# Patient Record
Sex: Female | Born: 1935 | Race: White | Hispanic: No | Smoking: Former smoker
Health system: Southern US, Community
[De-identification: ages and names within clinical notes are randomized; demographics above are authoritative.]

## PROBLEM LIST (undated history)

## (undated) DIAGNOSIS — M1990 Unspecified osteoarthritis, unspecified site: Secondary | ICD-10-CM

## (undated) DIAGNOSIS — K219 Gastro-esophageal reflux disease without esophagitis: Secondary | ICD-10-CM

## (undated) DIAGNOSIS — I10 Essential (primary) hypertension: Secondary | ICD-10-CM

## (undated) DIAGNOSIS — J189 Pneumonia, unspecified organism: Secondary | ICD-10-CM

## (undated) HISTORY — PX: ABDOMINAL HYSTERECTOMY: SHX81

## (undated) HISTORY — PX: FOOT SURGERY: SHX648

## (undated) HISTORY — PX: BACK SURGERY: SHX140

## (undated) HISTORY — PX: ANKLE FRACTURE SURGERY: SHX122

---

## 2003-07-27 ENCOUNTER — Inpatient Hospital Stay (HOSPITAL_COMMUNITY): Admission: EM | Admit: 2003-07-27 | Discharge: 2003-07-30 | Payer: Self-pay | Admitting: Emergency Medicine

## 2003-07-30 ENCOUNTER — Inpatient Hospital Stay
Admission: RE | Admit: 2003-07-30 | Discharge: 2003-08-07 | Payer: Self-pay | Admitting: Physical Medicine & Rehabilitation

## 2005-05-09 ENCOUNTER — Ambulatory Visit: Payer: Self-pay | Admitting: Internal Medicine

## 2006-05-10 ENCOUNTER — Ambulatory Visit: Payer: Self-pay | Admitting: Internal Medicine

## 2007-05-13 ENCOUNTER — Ambulatory Visit: Payer: Self-pay | Admitting: Internal Medicine

## 2008-05-14 ENCOUNTER — Ambulatory Visit: Payer: Self-pay | Admitting: Rheumatology

## 2008-05-14 ENCOUNTER — Ambulatory Visit: Payer: Self-pay | Admitting: Internal Medicine

## 2009-06-25 ENCOUNTER — Ambulatory Visit: Payer: Self-pay | Admitting: Internal Medicine

## 2010-03-31 ENCOUNTER — Ambulatory Visit: Payer: Self-pay | Admitting: Gastroenterology

## 2010-06-06 ENCOUNTER — Ambulatory Visit: Payer: Self-pay | Admitting: Internal Medicine

## 2010-06-15 ENCOUNTER — Ambulatory Visit: Payer: Self-pay | Admitting: Gastroenterology

## 2010-06-20 ENCOUNTER — Ambulatory Visit: Payer: Self-pay | Admitting: Specialist

## 2010-06-27 ENCOUNTER — Ambulatory Visit: Payer: Self-pay | Admitting: Internal Medicine

## 2010-09-20 ENCOUNTER — Ambulatory Visit: Payer: Self-pay | Admitting: Specialist

## 2010-12-05 ENCOUNTER — Ambulatory Visit: Payer: Self-pay | Admitting: Internal Medicine

## 2011-01-09 ENCOUNTER — Ambulatory Visit: Payer: Self-pay | Admitting: Gastroenterology

## 2011-01-27 ENCOUNTER — Ambulatory Visit: Payer: Self-pay | Admitting: Specialist

## 2011-05-19 ENCOUNTER — Ambulatory Visit: Payer: Self-pay | Admitting: Internal Medicine

## 2011-08-03 ENCOUNTER — Ambulatory Visit: Payer: Self-pay | Admitting: Internal Medicine

## 2011-09-08 ENCOUNTER — Ambulatory Visit: Payer: Self-pay | Admitting: Specialist

## 2012-01-09 ENCOUNTER — Other Ambulatory Visit: Payer: Self-pay | Admitting: Gastroenterology

## 2012-01-09 DIAGNOSIS — K5732 Diverticulitis of large intestine without perforation or abscess without bleeding: Secondary | ICD-10-CM

## 2012-01-18 ENCOUNTER — Ambulatory Visit
Admission: RE | Admit: 2012-01-18 | Discharge: 2012-01-18 | Disposition: A | Discharge status: Home / Self Care | Payer: Medicare Other | Source: Ambulatory Visit | Attending: Gastroenterology | Admitting: Gastroenterology

## 2012-01-18 DIAGNOSIS — K5732 Diverticulitis of large intestine without perforation or abscess without bleeding: Secondary | ICD-10-CM

## 2012-02-26 ENCOUNTER — Ambulatory Visit: Payer: Self-pay | Admitting: Specialist

## 2012-08-05 ENCOUNTER — Ambulatory Visit: Payer: Self-pay | Admitting: Internal Medicine

## 2012-12-19 ENCOUNTER — Ambulatory Visit: Payer: Self-pay | Admitting: Internal Medicine

## 2013-03-07 ENCOUNTER — Ambulatory Visit: Payer: Self-pay | Admitting: Internal Medicine

## 2013-03-10 ENCOUNTER — Ambulatory Visit: Payer: Self-pay | Admitting: Internal Medicine

## 2013-04-24 ENCOUNTER — Ambulatory Visit: Payer: Self-pay | Admitting: Specialist

## 2013-08-06 ENCOUNTER — Ambulatory Visit: Payer: Self-pay | Admitting: Internal Medicine

## 2013-10-16 ENCOUNTER — Ambulatory Visit: Payer: Self-pay | Admitting: Specialist

## 2013-12-05 ENCOUNTER — Ambulatory Visit: Payer: Self-pay | Admitting: Internal Medicine

## 2013-12-11 ENCOUNTER — Other Ambulatory Visit: Payer: Self-pay | Admitting: Gastroenterology

## 2013-12-11 ENCOUNTER — Ambulatory Visit: Payer: Self-pay | Admitting: Internal Medicine

## 2013-12-11 LAB — CLOSTRIDIUM DIFFICILE(ARMC)

## 2013-12-13 LAB — STOOL CULTURE

## 2014-01-23 ENCOUNTER — Other Ambulatory Visit: Payer: Self-pay | Admitting: Gastroenterology

## 2014-01-23 DIAGNOSIS — K5792 Diverticulitis of intestine, part unspecified, without perforation or abscess without bleeding: Secondary | ICD-10-CM

## 2014-02-03 ENCOUNTER — Inpatient Hospital Stay: Admission: RE | Admit: 2014-02-03 | Payer: Medicare Other | Source: Ambulatory Visit

## 2014-02-17 ENCOUNTER — Ambulatory Visit
Admission: RE | Admit: 2014-02-17 | Discharge: 2014-02-17 | Disposition: A | Discharge status: Home / Self Care | Payer: Medicare Other | Source: Ambulatory Visit | Attending: Gastroenterology | Admitting: Gastroenterology

## 2014-02-17 DIAGNOSIS — K5792 Diverticulitis of intestine, part unspecified, without perforation or abscess without bleeding: Secondary | ICD-10-CM

## 2014-05-14 ENCOUNTER — Emergency Department: Payer: Self-pay | Admitting: Emergency Medicine

## 2014-05-14 LAB — COMPREHENSIVE METABOLIC PANEL
ALBUMIN: 3.5 g/dL (ref 3.4–5.0)
ALK PHOS: 55 U/L
Anion Gap: 11 (ref 7–16)
BUN: 9 mg/dL (ref 7–18)
Bilirubin,Total: 0.7 mg/dL (ref 0.2–1.0)
Calcium, Total: 8.6 mg/dL (ref 8.5–10.1)
Chloride: 95 mmol/L — ABNORMAL LOW (ref 98–107)
Co2: 27 mmol/L (ref 21–32)
Creatinine: 0.69 mg/dL (ref 0.60–1.30)
EGFR (African American): >60
GLUCOSE: 109 mg/dL — AB (ref 65–99)
Osmolality: 266 (ref 275–301)
POTASSIUM: 3.2 mmol/L — AB (ref 3.5–5.1)
SGOT(AST): 25 U/L (ref 15–37)
SGPT (ALT): 19 U/L
SODIUM: 133 mmol/L — AB (ref 136–145)
TOTAL PROTEIN: 6.2 g/dL — AB (ref 6.4–8.2)

## 2014-05-14 LAB — CBC WITH DIFFERENTIAL/PLATELET
Basophil #: 0.1 10*3/uL (ref 0.0–0.1)
Basophil %: 1 %
Eosinophil #: 0.2 10*3/uL (ref 0.0–0.7)
Eosinophil %: 4.3 %
HCT: 39.7 % (ref 35.0–47.0)
HGB: 13.2 g/dL (ref 12.0–16.0)
Lymphocyte #: 2.2 10*3/uL (ref 1.0–3.6)
Lymphocyte %: 40.9 %
MCH: 32.3 pg (ref 26.0–34.0)
MCHC: 33.2 g/dL (ref 32.0–36.0)
MCV: 97 fL (ref 80–100)
Monocyte #: 0.3 x10 3/mm (ref 0.2–0.9)
Monocyte %: 4.9 %
NEUTROS ABS: 2.6 10*3/uL (ref 1.4–6.5)
Neutrophil %: 48.9 %
PLATELETS: 186 10*3/uL (ref 150–440)
RBC: 4.08 10*6/uL (ref 3.80–5.20)
RDW: 12.6 % (ref 11.5–14.5)
WBC: 5.4 10*3/uL (ref 3.6–11.0)

## 2014-05-14 LAB — CK TOTAL AND CKMB (NOT AT ARMC)
CK, Total: 57 U/L
CK-MB: 1.2 ng/mL (ref 0.5–3.6)

## 2014-05-14 LAB — URINALYSIS, COMPLETE
Bacteria: NONE SEEN
Bilirubin,UR: NEGATIVE
Blood: NEGATIVE
Glucose,UR: NEGATIVE mg/dL (ref 0–75)
KETONE: NEGATIVE
Nitrite: NEGATIVE
PROTEIN: NEGATIVE
Ph: 8 (ref 4.5–8.0)
SPECIFIC GRAVITY: 1.014 (ref 1.003–1.030)
Squamous Epithelial: 1

## 2014-05-14 LAB — TROPONIN I: Troponin-I: <0.02 ng/mL

## 2014-06-25 ENCOUNTER — Ambulatory Visit: Payer: Self-pay | Admitting: Internal Medicine

## 2014-07-02 ENCOUNTER — Other Ambulatory Visit (HOSPITAL_COMMUNITY): Payer: Self-pay | Admitting: Neurosurgery

## 2014-07-10 ENCOUNTER — Encounter (HOSPITAL_COMMUNITY): Payer: Self-pay

## 2014-07-15 NOTE — Pre-Procedure Instructions (Signed)
MALA GIBBARD  07/15/2014   Your procedure is scheduled on:  Thursday, Oct. 29th   Report to [facility] Admitting at 7:30 AM.             (per your surgeon's request)   Call this number if you have problems the morning of surgery: [phone]   Remember:   Do not eat food or drink liquids after midnight Wednesday.   Take these medicines the morning of surgery with A SIP OF WATER: Protonix   Do not wear jewelry, make-up or nail polish.  Do not wear lotions, powders, or perfumes. You may NOT wear deodorant.  Do not shave underarms & legs 48 hours prior to surgery.    Do not bring valuables to the hospital.  [facility] is not responsible for any belongings or valuables.               Contacts, dentures or bridgework may not be worn into surgery.  Leave suitcase in the car. After surgery it may be brought to your room.  For patients admitted to the hospital, discharge time is determined by your treatment team.    Name and phone number of your driver:    Special Instructions: "Preparing for Surgery" instruction sheet.   Please read over the following fact sheets that you were given: Pain Booklet, Coughing and Deep Breathing, Blood Transfusion Information, MRSA Information and Surgical Site Infection Prevention

## 2014-07-16 ENCOUNTER — Encounter (HOSPITAL_COMMUNITY)
Admission: RE | Admit: 2014-07-16 | Discharge: 2014-07-16 | Disposition: A | Discharge status: Home / Self Care | Payer: Medicare Other | Source: Ambulatory Visit | Attending: Neurosurgery | Admitting: Neurosurgery

## 2014-07-16 ENCOUNTER — Ambulatory Visit (HOSPITAL_COMMUNITY)
Admission: RE | Admit: 2014-07-16 | Discharge: 2014-07-16 | Disposition: A | Discharge status: Home / Self Care | Payer: Medicare Other | Source: Ambulatory Visit | Attending: Anesthesiology | Admitting: Anesthesiology

## 2014-07-16 ENCOUNTER — Encounter (HOSPITAL_COMMUNITY): Payer: Self-pay

## 2014-07-16 DIAGNOSIS — K219 Gastro-esophageal reflux disease without esophagitis: Secondary | ICD-10-CM | POA: Insufficient documentation

## 2014-07-16 DIAGNOSIS — Z01810 Encounter for preprocedural cardiovascular examination: Secondary | ICD-10-CM | POA: Insufficient documentation

## 2014-07-16 DIAGNOSIS — Z01818 Encounter for other preprocedural examination: Secondary | ICD-10-CM | POA: Insufficient documentation

## 2014-07-16 DIAGNOSIS — Z01811 Encounter for preprocedural respiratory examination: Secondary | ICD-10-CM

## 2014-07-16 DIAGNOSIS — I10 Essential (primary) hypertension: Secondary | ICD-10-CM | POA: Diagnosis not present

## 2014-07-16 DIAGNOSIS — M4806 Spinal stenosis, lumbar region: Secondary | ICD-10-CM | POA: Diagnosis not present

## 2014-07-16 HISTORY — DX: Gastro-esophageal reflux disease without esophagitis: K21.9

## 2014-07-16 HISTORY — DX: Unspecified osteoarthritis, unspecified site: M19.90

## 2014-07-16 HISTORY — DX: Essential (primary) hypertension: I10

## 2014-07-16 LAB — COMPREHENSIVE METABOLIC PANEL
ALT: 15 U/L (ref 0–35)
AST: 20 U/L (ref 0–37)
Albumin: 4.2 g/dL (ref 3.5–5.2)
Alkaline Phosphatase: 59 U/L (ref 39–117)
Anion gap: 11 (ref 5–15)
BUN: 7 mg/dL (ref 6–23)
CO2: 28 mEq/L (ref 19–32)
Calcium: 9.5 mg/dL (ref 8.4–10.5)
Chloride: 89 mEq/L — ABNORMAL LOW (ref 96–112)
Creatinine, Ser: 0.67 mg/dL (ref 0.50–1.10)
GFR calc Af Amer: >90 mL/min (ref >90)
GFR calc non Af Amer: 78 mL/min — ABNORMAL LOW (ref >90)
Glucose, Bld: 94 mg/dL (ref 70–99)
Potassium: 4.1 mEq/L (ref 3.7–5.3)
Sodium: 128 mEq/L — ABNORMAL LOW (ref 137–147)
Total Bilirubin: 0.6 mg/dL (ref 0.3–1.2)
Total Protein: 7.1 g/dL (ref 6.0–8.3)

## 2014-07-16 LAB — ABO/RH: ABO/RH(D): O POS

## 2014-07-16 LAB — TYPE AND SCREEN
ABO/RH(D): O POS
Antibody Screen: NEGATIVE

## 2014-07-16 LAB — SURGICAL PCR SCREEN
MRSA, PCR: NEGATIVE
Staphylococcus aureus: NEGATIVE

## 2014-07-16 LAB — CBC
HCT: 43.6 % (ref 36.0–46.0)
Hemoglobin: 15.3 g/dL — ABNORMAL HIGH (ref 12.0–15.0)
MCH: 32.6 pg (ref 26.0–34.0)
MCHC: 35.1 g/dL (ref 30.0–36.0)
MCV: 93 fL (ref 78.0–100.0)
Platelets: 218 10*3/uL (ref 150–400)
RBC: 4.69 MIL/uL (ref 3.87–5.11)
RDW: 12.6 % (ref 11.5–15.5)
WBC: 5.8 10*3/uL (ref 4.0–10.5)

## 2014-07-16 NOTE — Progress Notes (Addendum)
Had Bronchoscopy with bx for a spot or shadow....its neg.  They think it might have come from [place]  Back 2004. She believes she had EKG done @ [facility].  I am unable to locate one, so I have sent them a request.  DA Na level @ 128  -- will let Ebony Hail know.  DA EKG shows RBBB.  Have called Dr. Doy Hutching office for old one to compare.  [phone]

## 2014-07-16 NOTE — Progress Notes (Signed)
Anesthesia Chart Review:  Pt is 80 year old female scheduled for L4-5 PLIF with interbody prosthesis. Posterior lateral arthrodesis and posterior nonsegmental instrumentation on 07/23/2014 with Dr. Arnoldo Morale.   PMH: HTN, GERD  Medications include: fosinopril, hctz, ASA, protonix  Preoperative labs reviewed.  Na 128, Cl 89. Reviewed pt's prior labs in care everywhere, pt appears to have consistently low NaCl. Will recheck istat DOS.   Chest x-ray reviewed.  Consolidation of left perihilar and infrahilar region. The prior CTs  does demonstrated patient has chronic consolidative changes of the medial left lower lobe. However, the degree of consolidation on today's chest x-ray appears more prominent compared to prior CT. Superimposed acute process is not excluded.  Reviewed notes in care everywhere. Pt was seen on 06/25/2014 by pulmonogist at [facility] Dr. Fulton Reek for f/u on bronchiectasis and LLL consolidative infiltrate. Pt had hx of benign bronchoscopy for this problem. Per this note, it appears this is a stable problem for which pt receives regular follow up. Next follow up is scheduled for 1 year, next CT of chest in 09/2014.    EKG: NSR. RBBB. She had incomplete RBBB on 07/27/2003.   Discussed with Dr. Marcie Bal. If pt is asymptomatic DOS and her hyponatremia is stable, I anticipate she will be able to proceed with surgery as scheduled.   Willeen Cass, FNP-BC [facility]/Anesthesiology Phone: [phone] 07/16/2014 4:58 PM

## 2014-07-22 MED ORDER — CEFAZOLIN SODIUM-DEXTROSE 2-3 GM-% IV SOLR
2.0000 g | INTRAVENOUS | Status: AC
  Administered 2014-07-23: 2 g via INTRAVENOUS
  Filled 2014-07-22: qty 50

## 2014-07-23 ENCOUNTER — Encounter (HOSPITAL_COMMUNITY)
Admission: RE | Disposition: A | Discharge status: Home / Self Care | Payer: Self-pay | Source: Ambulatory Visit | Attending: Neurosurgery

## 2014-07-23 ENCOUNTER — Inpatient Hospital Stay (HOSPITAL_COMMUNITY): Payer: Medicare Other

## 2014-07-23 ENCOUNTER — Encounter (HOSPITAL_COMMUNITY): Payer: Medicare Other | Admitting: Emergency Medicine

## 2014-07-23 ENCOUNTER — Inpatient Hospital Stay (HOSPITAL_COMMUNITY): Payer: Medicare Other | Admitting: Certified Registered Nurse Anesthetist

## 2014-07-23 ENCOUNTER — Inpatient Hospital Stay (HOSPITAL_COMMUNITY)
Admission: RE | Admit: 2014-07-23 | Discharge: 2014-07-25 | DRG: 460 | Disposition: A | Discharge status: Home / Self Care | Payer: Medicare Other | Source: Ambulatory Visit | Attending: Neurosurgery | Admitting: Neurosurgery

## 2014-07-23 ENCOUNTER — Encounter (HOSPITAL_COMMUNITY): Payer: Self-pay | Admitting: *Deleted

## 2014-07-23 DIAGNOSIS — M1990 Unspecified osteoarthritis, unspecified site: Secondary | ICD-10-CM | POA: Diagnosis present

## 2014-07-23 DIAGNOSIS — M79606 Pain in leg, unspecified: Secondary | ICD-10-CM | POA: Diagnosis present

## 2014-07-23 DIAGNOSIS — I10 Essential (primary) hypertension: Secondary | ICD-10-CM | POA: Diagnosis present

## 2014-07-23 DIAGNOSIS — M5136 Other intervertebral disc degeneration, lumbar region: Secondary | ICD-10-CM | POA: Diagnosis present

## 2014-07-23 DIAGNOSIS — M4316 Spondylolisthesis, lumbar region: Principal | ICD-10-CM

## 2014-07-23 DIAGNOSIS — K219 Gastro-esophageal reflux disease without esophagitis: Secondary | ICD-10-CM | POA: Diagnosis present

## 2014-07-23 DIAGNOSIS — Z87891 Personal history of nicotine dependence: Secondary | ICD-10-CM

## 2014-07-23 DIAGNOSIS — Z79899 Other long term (current) drug therapy: Secondary | ICD-10-CM | POA: Diagnosis not present

## 2014-07-23 DIAGNOSIS — Z7982 Long term (current) use of aspirin: Secondary | ICD-10-CM | POA: Diagnosis not present

## 2014-07-23 LAB — POCT I-STAT 4, (NA,K, GLUC, HGB,HCT)
GLUCOSE: 101 mg/dL — AB (ref 70–99)
HEMATOCRIT: 47 % — AB (ref 36.0–46.0)
HEMOGLOBIN: 16 g/dL — AB (ref 12.0–15.0)
POTASSIUM: 3.1 meq/L — AB (ref 3.7–5.3)
SODIUM: 130 meq/L — AB (ref 137–147)

## 2014-07-23 SURGERY — POSTERIOR LUMBAR FUSION 1 LEVEL
Anesthesia: General

## 2014-07-23 MED ORDER — PHENYLEPHRINE HCL 10 MG/ML IJ SOLN
10.0000 mg | INTRAVENOUS | Status: DC | PRN
  Administered 2014-07-23: 10 ug/min via INTRAVENOUS

## 2014-07-23 MED ORDER — BUPIVACAINE LIPOSOME 1.3 % IJ SUSP
20.0000 mL | INTRAMUSCULAR | Status: AC
  Filled 2014-07-23: qty 20

## 2014-07-23 MED ORDER — ONDANSETRON HCL 4 MG/2ML IJ SOLN: 4.0000 mg | INTRAMUSCULAR | Status: DC | PRN

## 2014-07-23 MED ORDER — ACETAMINOPHEN 325 MG PO TABS
650.0000 mg | ORAL_TABLET | ORAL | Status: DC | PRN
  Administered 2014-07-23 – 2014-07-25 (×5): 650 mg via ORAL
  Filled 2014-07-23 (×5): qty 2

## 2014-07-23 MED ORDER — ARTIFICIAL TEARS OP OINT
TOPICAL_OINTMENT | OPHTHALMIC | Status: DC | PRN
  Administered 2014-07-23: 1 via OPHTHALMIC

## 2014-07-23 MED ORDER — DEXAMETHASONE SODIUM PHOSPHATE 4 MG/ML IJ SOLN
INTRAMUSCULAR | Status: DC | PRN
  Administered 2014-07-23: 4 mg via INTRAVENOUS

## 2014-07-23 MED ORDER — SODIUM CHLORIDE 0.9 % IR SOLN
Status: DC | PRN
  Administered 2014-07-23: 10:00:00

## 2014-07-23 MED ORDER — ALUM & MAG HYDROXIDE-SIMETH 200-200-20 MG/5ML PO SUSP: 30.0000 mL | Freq: Four times a day (QID) | ORAL | Status: DC | PRN

## 2014-07-23 MED ORDER — GLYCOPYRROLATE 0.2 MG/ML IJ SOLN
INTRAMUSCULAR | Status: DC | PRN
  Administered 2014-07-23: .4 mg via INTRAVENOUS
  Administered 2014-07-23: .1 mg via INTRAVENOUS

## 2014-07-23 MED ORDER — ONDANSETRON HCL 4 MG/2ML IJ SOLN
INTRAMUSCULAR | Status: DC | PRN
  Administered 2014-07-23 (×2): 4 mg via INTRAVENOUS

## 2014-07-23 MED ORDER — OXYCODONE HCL 5 MG/5ML PO SOLN: 5.0000 mg | Freq: Once | ORAL | Status: DC | PRN

## 2014-07-23 MED ORDER — BACITRACIN ZINC 500 UNIT/GM EX OINT
TOPICAL_OINTMENT | CUTANEOUS | Status: DC | PRN
  Administered 2014-07-23: 1 via TOPICAL

## 2014-07-23 MED ORDER — KETOROLAC TROMETHAMINE 30 MG/ML IJ SOLN
INTRAMUSCULAR | Status: AC
  Filled 2014-07-23: qty 2

## 2014-07-23 MED ORDER — DIAZEPAM 5 MG PO TABS
ORAL_TABLET | ORAL | Status: AC
  Administered 2014-07-23: 5 mg via ORAL
  Filled 2014-07-23: qty 1

## 2014-07-23 MED ORDER — THROMBIN 20000 UNITS EX SOLR
CUTANEOUS | Status: DC | PRN
  Administered 2014-07-23: 10:00:00 via TOPICAL

## 2014-07-23 MED ORDER — PHENYLEPHRINE HCL 10 MG/ML IJ SOLN
INTRAMUSCULAR | Status: DC | PRN
  Administered 2014-07-23 (×3): 80 ug via INTRAVENOUS
  Administered 2014-07-23 (×2): 40 ug via INTRAVENOUS
  Administered 2014-07-23: 80 ug via INTRAVENOUS

## 2014-07-23 MED ORDER — DIAZEPAM 5 MG PO TABS
5.0000 mg | ORAL_TABLET | Freq: Four times a day (QID) | ORAL | Status: DC | PRN
  Administered 2014-07-23: 5 mg via ORAL

## 2014-07-23 MED ORDER — ONDANSETRON HCL 4 MG/2ML IJ SOLN
4.0000 mg | Freq: Once | INTRAMUSCULAR | Status: DC | PRN
Start: 2014-07-23 — End: 2014-07-23

## 2014-07-23 MED ORDER — GLYCOPYRROLATE 0.2 MG/ML IJ SOLN
INTRAMUSCULAR | Status: AC
  Filled 2014-07-23: qty 2

## 2014-07-23 MED ORDER — HYDROCHLOROTHIAZIDE 25 MG PO TABS
25.0000 mg | ORAL_TABLET | Freq: Every day | ORAL | Status: DC
  Administered 2014-07-23 – 2014-07-25 (×3): 25 mg via ORAL
  Filled 2014-07-23 (×3): qty 1

## 2014-07-23 MED ORDER — BUPIVACAINE-EPINEPHRINE (PF) 0.5% -1:200000 IJ SOLN
INTRAMUSCULAR | Status: DC | PRN
  Administered 2014-07-23: 10 mL via PERINEURAL

## 2014-07-23 MED ORDER — OXYCODONE HCL 5 MG PO TABS: 5.0000 mg | ORAL_TABLET | Freq: Once | ORAL | Status: DC | PRN

## 2014-07-23 MED ORDER — PROPOFOL 10 MG/ML IV BOLUS
INTRAVENOUS | Status: AC
  Filled 2014-07-23: qty 20

## 2014-07-23 MED ORDER — LACTATED RINGERS IV SOLN
INTRAVENOUS | Status: DC
  Administered 2014-07-23: 16:00:00 via INTRAVENOUS

## 2014-07-23 MED ORDER — HYDROCODONE-ACETAMINOPHEN 5-325 MG PO TABS: 1.0000 | ORAL_TABLET | ORAL | Status: DC | PRN

## 2014-07-23 MED ORDER — GLYCOPYRROLATE 0.2 MG/ML IJ SOLN
INTRAMUSCULAR | Status: AC
  Filled 2014-07-23: qty 3

## 2014-07-23 MED ORDER — DOCUSATE SODIUM 100 MG PO CAPS
100.0000 mg | ORAL_CAPSULE | Freq: Two times a day (BID) | ORAL | Status: DC
  Administered 2014-07-23 – 2014-07-25 (×4): 100 mg via ORAL
  Filled 2014-07-23 (×4): qty 1

## 2014-07-23 MED ORDER — LISINOPRIL 20 MG PO TABS
40.0000 mg | ORAL_TABLET | Freq: Every day | ORAL | Status: DC
  Administered 2014-07-23 – 2014-07-25 (×3): 40 mg via ORAL
  Filled 2014-07-23 (×3): qty 2

## 2014-07-23 MED ORDER — PANTOPRAZOLE SODIUM 40 MG PO TBEC
40.0000 mg | DELAYED_RELEASE_TABLET | Freq: Every day | ORAL | Status: DC
  Administered 2014-07-24 – 2014-07-25 (×2): 40 mg via ORAL
  Filled 2014-07-23 (×2): qty 1

## 2014-07-23 MED ORDER — FENTANYL CITRATE 0.05 MG/ML IJ SOLN
INTRAMUSCULAR | Status: AC
  Filled 2014-07-23: qty 5

## 2014-07-23 MED ORDER — 0.9 % SODIUM CHLORIDE (POUR BTL) OPTIME
TOPICAL | Status: DC | PRN
  Administered 2014-07-23: 1000 mL

## 2014-07-23 MED ORDER — BUPIVACAINE LIPOSOME 1.3 % IJ SUSP
INTRAMUSCULAR | Status: DC | PRN
Start: 2014-07-23 — End: 2014-07-23
  Administered 2014-07-23: 20 mL

## 2014-07-23 MED ORDER — PHENOL 1.4 % MT LIQD: 1.0000 | OROMUCOSAL | Status: DC | PRN

## 2014-07-23 MED ORDER — OXYCODONE-ACETAMINOPHEN 5-325 MG PO TABS: 1.0000 | ORAL_TABLET | ORAL | Status: DC | PRN

## 2014-07-23 MED ORDER — FOSINOPRIL SODIUM 20 MG PO TABS: 40.0000 mg | ORAL_TABLET | Freq: Every day | ORAL | Status: DC

## 2014-07-23 MED ORDER — FENTANYL CITRATE 0.05 MG/ML IJ SOLN
25.0000 ug | INTRAMUSCULAR | Status: DC | PRN
  Administered 2014-07-23 (×3): 25 ug via INTRAVENOUS

## 2014-07-23 MED ORDER — LACTATED RINGERS IV SOLN
INTRAVENOUS | Status: DC | PRN
  Administered 2014-07-23 (×3): via INTRAVENOUS

## 2014-07-23 MED ORDER — NEOSTIGMINE METHYLSULFATE 10 MG/10ML IV SOLN
INTRAVENOUS | Status: DC | PRN
  Administered 2014-07-23: 3 mg via INTRAVENOUS

## 2014-07-23 MED ORDER — LACTATED RINGERS IV SOLN
INTRAVENOUS | Status: DC
Start: 2014-07-23 — End: 2014-07-25
  Administered 2014-07-23: 09:00:00 via INTRAVENOUS

## 2014-07-23 MED ORDER — NEOSTIGMINE METHYLSULFATE 10 MG/10ML IV SOLN
INTRAVENOUS | Status: AC
  Filled 2014-07-23: qty 1

## 2014-07-23 MED ORDER — FENTANYL CITRATE 0.05 MG/ML IJ SOLN
INTRAMUSCULAR | Status: DC | PRN
  Administered 2014-07-23 (×2): 25 ug via INTRAVENOUS
  Administered 2014-07-23: 50 ug via INTRAVENOUS
  Administered 2014-07-23: 100 ug via INTRAVENOUS

## 2014-07-23 MED ORDER — ACETAMINOPHEN 650 MG RE SUPP: 650.0000 mg | RECTAL | Status: DC | PRN

## 2014-07-23 MED ORDER — FENTANYL CITRATE 0.05 MG/ML IJ SOLN
INTRAMUSCULAR | Status: AC
  Administered 2014-07-23: 25 ug via INTRAVENOUS
  Filled 2014-07-23: qty 2

## 2014-07-23 MED ORDER — ROCURONIUM BROMIDE 100 MG/10ML IV SOLN
INTRAVENOUS | Status: DC | PRN
  Administered 2014-07-23: 30 mg via INTRAVENOUS

## 2014-07-23 MED ORDER — CEFAZOLIN SODIUM-DEXTROSE 2-3 GM-% IV SOLR
2.0000 g | Freq: Three times a day (TID) | INTRAVENOUS | Status: AC
  Administered 2014-07-23 – 2014-07-24 (×2): 2 g via INTRAVENOUS
  Filled 2014-07-23 (×2): qty 50

## 2014-07-23 MED ORDER — PROPOFOL 10 MG/ML IV BOLUS
INTRAVENOUS | Status: DC | PRN
  Administered 2014-07-23: 120 mg via INTRAVENOUS

## 2014-07-23 MED ORDER — MORPHINE SULFATE 2 MG/ML IJ SOLN: 1.0000 mg | INTRAMUSCULAR | Status: DC | PRN

## 2014-07-23 MED ORDER — KETOROLAC TROMETHAMINE 30 MG/ML IJ SOLN
INTRAMUSCULAR | Status: DC | PRN
  Administered 2014-07-23: 15 mg via INTRAVENOUS

## 2014-07-23 MED ORDER — MENTHOL 3 MG MT LOZG: 1.0000 | LOZENGE | OROMUCOSAL | Status: DC | PRN

## 2014-07-23 SURGICAL SUPPLY — 66 items
BAG DECANTER FOR FLEXI CONT (MISCELLANEOUS) ×3 IMPLANT
BENZOIN TINCTURE PRP APPL 2/3 (GAUZE/BANDAGES/DRESSINGS) ×3 IMPLANT
BLADE CLIPPER SURG (BLADE) IMPLANT
BRUSH SCRUB EZ PLAIN DRY (MISCELLANEOUS) ×3 IMPLANT
BUR MATCHSTICK NEURO 3.0 LAGG (BURR) ×3 IMPLANT
BUR PRECISION FLUTE 6.0 (BURR) ×3 IMPLANT
CANISTER SUCT 3000ML (MISCELLANEOUS) ×3 IMPLANT
CAP REVERE LOCKING (Cap) ×12 IMPLANT
CLOSURE WOUND 1/2 X4 (GAUZE/BANDAGES/DRESSINGS) ×1
CONT SPEC 4OZ CLIKSEAL STRL BL (MISCELLANEOUS) ×3 IMPLANT
COVER BACK TABLE 60X90IN (DRAPES) ×3 IMPLANT
DERMABOND ADVANCED (GAUZE/BANDAGES/DRESSINGS) ×2
DERMABOND ADVANCED .7 DNX12 (GAUZE/BANDAGES/DRESSINGS) ×1 IMPLANT
DRAPE C-ARM 42X72 X-RAY (DRAPES) ×6 IMPLANT
DRAPE LAPAROTOMY 100X72X124 (DRAPES) ×3 IMPLANT
DRAPE POUCH INSTRU U-SHP 10X18 (DRAPES) ×3 IMPLANT
DRAPE PROXIMA HALF (DRAPES) ×3 IMPLANT
DRAPE SURG 17X23 STRL (DRAPES) ×12 IMPLANT
ELECT BLADE 4.0 EZ CLEAN MEGAD (MISCELLANEOUS) ×3
ELECT REM PT RETURN 9FT ADLT (ELECTROSURGICAL) ×3
ELECTRODE BLDE 4.0 EZ CLN MEGD (MISCELLANEOUS) ×1 IMPLANT
ELECTRODE REM PT RTRN 9FT ADLT (ELECTROSURGICAL) ×1 IMPLANT
EVACUATOR 1/8 PVC DRAIN (DRAIN) IMPLANT
GAUZE SPONGE 4X4 12PLY STRL (GAUZE/BANDAGES/DRESSINGS) ×3 IMPLANT
GAUZE SPONGE 4X4 16PLY XRAY LF (GAUZE/BANDAGES/DRESSINGS) ×3 IMPLANT
GLOVE BIO SURGEON STRL SZ8 (GLOVE) ×3 IMPLANT
GLOVE BIO SURGEON STRL SZ8.5 (GLOVE) ×6 IMPLANT
GLOVE EXAM NITRILE LRG STRL (GLOVE) IMPLANT
GLOVE EXAM NITRILE MD LF STRL (GLOVE) IMPLANT
GLOVE EXAM NITRILE XL STR (GLOVE) IMPLANT
GLOVE EXAM NITRILE XS STR PU (GLOVE) IMPLANT
GLOVE INDICATOR 8.5 STRL (GLOVE) ×3 IMPLANT
GLOVE SS BIOGEL STRL SZ 8 (GLOVE) ×2 IMPLANT
GLOVE SUPERSENSE BIOGEL SZ 8 (GLOVE) ×4
GOWN STRL REUS W/ TWL LRG LVL3 (GOWN DISPOSABLE) ×3 IMPLANT
GOWN STRL REUS W/ TWL XL LVL3 (GOWN DISPOSABLE) ×2 IMPLANT
GOWN STRL REUS W/TWL 2XL LVL3 (GOWN DISPOSABLE) IMPLANT
GOWN STRL REUS W/TWL LRG LVL3 (GOWN DISPOSABLE) ×6
GOWN STRL REUS W/TWL XL LVL3 (GOWN DISPOSABLE) ×4
KIT BASIN OR (CUSTOM PROCEDURE TRAY) ×3 IMPLANT
KIT ROOM TURNOVER OR (KITS) ×3 IMPLANT
NEEDLE HYPO 21X1.5 SAFETY (NEEDLE) ×3 IMPLANT
NEEDLE HYPO 22GX1.5 SAFETY (NEEDLE) ×3 IMPLANT
NS IRRIG 1000ML POUR BTL (IV SOLUTION) ×3 IMPLANT
PACK LAMINECTOMY NEURO (CUSTOM PROCEDURE TRAY) ×3 IMPLANT
PAD ARMBOARD 7.5X6 YLW CONV (MISCELLANEOUS) ×9 IMPLANT
PATTIES SURGICAL .5 X1 (DISPOSABLE) IMPLANT
ROD REVERE 6.35 40MM (Rod) ×6 IMPLANT
SCREW REVERE 6.35 6.5X55MM (Screw) ×6 IMPLANT
SCREW REVERE 6.5X50MM (Screw) ×6 IMPLANT
SPACER SUSTAIN O 10X26 12MM (Spacer) ×6 IMPLANT
SPONGE LAP 4X18 X RAY DECT (DISPOSABLE) IMPLANT
SPONGE NEURO XRAY DETECT 1X3 (DISPOSABLE) IMPLANT
SPONGE SURGIFOAM ABS GEL 100 (HEMOSTASIS) ×3 IMPLANT
STRIP BIOACTIVE 20CC 25X100X8 (Miscellaneous) ×3 IMPLANT
STRIP CLOSURE SKIN 1/2X4 (GAUZE/BANDAGES/DRESSINGS) ×2 IMPLANT
SUT VIC AB 1 CT1 18XBRD ANBCTR (SUTURE) ×2 IMPLANT
SUT VIC AB 1 CT1 8-18 (SUTURE) ×4
SUT VIC AB 2-0 CP2 18 (SUTURE) ×6 IMPLANT
SYR 20CC LL (SYRINGE) ×3 IMPLANT
SYR 20ML ECCENTRIC (SYRINGE) ×3 IMPLANT
TAPE CLOTH SURG 4X10 WHT LF (GAUZE/BANDAGES/DRESSINGS) ×3 IMPLANT
TOWEL OR 17X24 6PK STRL BLUE (TOWEL DISPOSABLE) ×3 IMPLANT
TOWEL OR 17X26 10 PK STRL BLUE (TOWEL DISPOSABLE) ×3 IMPLANT
TRAY FOLEY CATH 14FRSI W/METER (CATHETERS) ×3 IMPLANT
WATER STERILE IRR 1000ML POUR (IV SOLUTION) ×3 IMPLANT

## 2014-07-23 NOTE — OR Nursing (Signed)
Spoke with 2nd floor pharmacy concerning patient allergy to novacaine stated okay to use exparel

## 2014-07-23 NOTE — Progress Notes (Signed)
Pt received via stretcher from PACU at 15:05pm.  Report given by Jinny Blossom, RN. Pt alert, verbal oriented x4 with no noted distress. HOB elevated. Pt on 2L of O2 via n/c at this time O2 sat 100%. Pt stable denies pain or discomfort. IV infusing to left forearm transparent dressing dry and intact. Foley in place with clear, yellow urine to bag. Dressing to back clean, dry, and intact. Hemovac suctioning blood in place. Family at bedside. Pt educated and oriented to room. Safety measures in place. Call bell within reach. Will continue to monitor.

## 2014-07-23 NOTE — Transfer of Care (Signed)
Immediate Anesthesia Transfer of Care Note  Patient: Christina Coffey  Procedure(s) Performed: Procedure(s): LUMBAR FOUR-FIVE POSTERIOR LUMBAR INTERBODY FUSION ,POSTERIOR LATERAL ARTHRODESIS AND POSTERIOR NONSEGMENTAL INSTRUMENTATION (N/A)  Patient Location: PACU  Anesthesia Type:General  Level of Consciousness: awake, alert  and oriented  Airway & Oxygen Therapy: Patient Spontanous Breathing  Post-op Assessment: Report given to PACU RN and Post -op Vital signs reviewed and stable  Post vital signs: Reviewed and stable  Complications: No apparent anesthesia complications

## 2014-07-23 NOTE — Op Note (Signed)
Brief history: The patient is an 80 year old white female who has complained of back and leg pain consistent with neurogenic claudication. She has failed medical management and was worked up with a lumbar MRI. This demonstrated the patient had a spondylolisthesis and severe spinal stenosis at L4-5. I discussed the various treatment options with the patient including surgery. She has weighed the risks, benefits, and alternatives surgery and decided proceed with an L4-5 decompression, instrumentation, and fusion.  Preoperative diagnosis: L4-5 spondylolisthesis, facet arthropathy, Degenerative disc disease, spinal stenosis compressing both the L4 and the L5 nerve roots; lumbago; lumbar radiculopathy  Postoperative diagnosis: The same  Procedure: Bilateral L4 Laminotomy/foraminotomies to decompress the bilateral L4 and L5 nerve roots(the work required to do this was in addition to the work required to do the posterior lumbar interbody fusion because of the patient's spinal stenosis, facet arthropathy. Etc. requiring a wide decompression of the nerve roots.); L4-5 posterior lumbar interbody fusion with local morselized autograft bone and Kinnex graft extender; insertion of interbody prosthesis at L4-5 (globus peek interbody prosthesis); posterior nonsegmental instrumentation from L4 to L5 with globus titanium pedicle screws and rods; posterior lateral arthrodesis at L4-5 with local morselized autograft bone and Kinnex bone graft extender.  Surgeon: Dr. Earle Gell  Asst.: Dr. Erline Levine  Anesthesia: Gen. endotracheal  Estimated blood loss: 200 mL  Drains: One medium Hemovac  Complications: None  Description of procedure: The patient was brought to the operating room by the anesthesia team. General endotracheal anesthesia was induced. The patient was turned to the prone position on the Wilson frame. The patient's lumbosacral region was then prepared with Betadine scrub and Betadine solution. Sterile  drapes were applied.  I then injected the area to be incised with Marcaine with epinephrine solution. I then used the scalpel to make a linear midline incision over the L4-5 interspace. I then used electrocautery to perform a bilateral subperiosteal dissection exposing the spinous process and lamina of L3, L4, L5 and S1. We then obtained intraoperative radiograph to confirm our location. We then inserted the Verstrac retractor to provide exposure.  I began the decompression by using the high speed drill to perform laminotomies at L4 bilaterally. We then used the Kerrison punches to widen the laminotomy and removed the ligamentum flavum at L4-5. We used the Kerrison punches to remove the medial facets at L4-5. We performed wide foraminotomies about the bilateral L4 and L5 nerve roots completing the decompression.  We now turned our attention to the posterior lumbar interbody fusion. I used a scalpel to incise the intervertebral disc at L4-5. I then performed a partial intervertebral discectomy at L4-5 using the pituitary forceps. We prepared the vertebral endplates at G8-6 for the fusion by removing the soft tissues with the curettes. We then used the trial spacers to pick the appropriate sized interbody prosthesis. We prefilled his prosthesis with a combination of local morselized autograft bone that we obtained during the decompression as well as Kinnex bone graft extender. We inserted the prefilled prosthesis into the interspace at L4-5. There was a good snug fit of the prosthesis in the interspace. We then filled and the remainder of the intervertebral disc space with local morselized autograft bone and Kinnex. This completed the posterior lumbar interbody arthrodesis.  We now turned attention to the instrumentation. Under fluoroscopic guidance we cannulated the bilateral L4 and L5 pedicles with the bone probe. We then removed the bone probe. We then tapped the pedicle with a 5.5 millimeter tap. We then  removed  the tap. We probed inside the tapped pedicle with a ball probe to rule out cortical breaches. We then inserted a 6.5 x 50 and 55 millimeter pedicle screw into the L4 and L5 pedicles bilaterally under fluoroscopic guidance. We then palpated along the medial aspect of the pedicles to rule out cortical breaches. There were none. The nerve roots were not injured. We then connected the unilateral pedicle screws with a lordotic rod. We compressed the construct and secured the rod in place with the caps. We then tightened the caps appropriately. This completed the instrumentation from L4-5.  We now turned our attention to the posterior lateral arthrodesis at L4-5. We used the high-speed drill to decorticate the remainder of the facets, pars, transverse process at L4-5. We then applied a combination of local morselized autograft bone and Kinnex bone graft extender over these decorticated posterior lateral structures. This completed the posterior lateral arthrodesis.  We then obtained hemostasis using bipolar electrocautery. We irrigated the wound out with bacitracin solution. We inspected the thecal sac and nerve roots and noted they were well decompressed. We then removed the retractor. We placed a medium Hemovac drain in the epidural space and tunneled out through separate stab wound. We reapproximated patient's thoracolumbar fascia with interrupted #1 Vicryl suture. We reapproximated patient's subcutaneous tissue with interrupted 2-0 Vicryl suture. The reapproximated patient's skin with Steri-Strips and benzoin. The wound was then coated with bacitracin ointment. A sterile dressing was applied. The drapes were removed. The patient was subsequently returned to the supine position where they were extubated by the anesthesia team. He was then transported to the post anesthesia care unit in stable condition. All sponge instrument and needle counts were reportedly correct at the end of this case.

## 2014-07-23 NOTE — Anesthesia Postprocedure Evaluation (Signed)
  Anesthesia Post-op Note  Patient: Christina Coffey  Procedure(s) Performed: Procedure(s): LUMBAR FOUR-FIVE POSTERIOR LUMBAR INTERBODY FUSION ,POSTERIOR LATERAL ARTHRODESIS AND POSTERIOR NONSEGMENTAL INSTRUMENTATION (N/A)  Patient Location: PACU  Anesthesia Type:General  Level of Consciousness: awake, alert  and oriented  Airway and Oxygen Therapy: Patient Spontanous Breathing and Patient connected to nasal cannula oxygen  Post-op Pain: mild  Post-op Assessment: Post-op Vital signs reviewed, Patient's Cardiovascular Status Stable, Respiratory Function Stable, Patent Airway and Pain level controlled  Post-op Vital Signs: stable  Last Vitals:  Filed Vitals:   07/23/14 1512  BP: 160/65  Pulse: 77  Temp: 36.4 C  Resp: 16    Complications: No apparent anesthesia complications

## 2014-07-23 NOTE — Anesthesia Procedure Notes (Signed)
Procedure Name: Intubation Date/Time: 07/23/2014 10:33 AM Performed by: Garner Nash Pre-anesthesia Checklist: Patient identified, Timeout performed, Emergency Drugs available, Suction available and Patient being monitored Patient Re-evaluated:Patient Re-evaluated prior to inductionOxygen Delivery Method: Circle system utilized Preoxygenation: Pre-oxygenation with 100% oxygen Intubation Type: IV induction Ventilation: Mask ventilation without difficulty Laryngoscope Size: Mac and 3 Grade View: Grade II Tube type: Oral Number of attempts: 1 Airway Equipment and Method: Stylet Placement Confirmation: ETT inserted through vocal cords under direct vision,  CO2 detector,  positive ETCO2 and breath sounds checked- equal and bilateral Secured at: 21 cm Tube secured with: Tape Dental Injury: Teeth and Oropharynx as per pre-operative assessment

## 2014-07-23 NOTE — Progress Notes (Signed)
Orthopedic Tech Progress Note Patient Details:  Christina Coffey May 19, 1927 [id number] Patient has brace Patient ID: [name], female   DOB: 1927/02/13, 80 y.o.   MRN: [id number]   Braulio Bosch 07/23/2014, 5:36 PM

## 2014-07-23 NOTE — Anesthesia Preprocedure Evaluation (Signed)
Anesthesia Evaluation    Airway Mallampati: II  TM Distance: >3 FB Neck ROM: Full    Dental  (+) Teeth Intact, Dental Advisory Given   Pulmonary former smoker,  breath sounds clear to auscultation        Cardiovascular hypertension, Rhythm:Regular Rate:Normal     Neuro/Psych    GI/Hepatic   Endo/Other    Renal/GU      Musculoskeletal   Abdominal   Peds  Hematology   Anesthesia Other Findings   Reproductive/Obstetrics                             Anesthesia Physical Anesthesia Plan  ASA: II  Anesthesia Plan: General   Post-op Pain Management:    Induction: Intravenous  Airway Management Planned: Oral ETT  Additional Equipment:   Intra-op Plan:   Post-operative Plan: Extubation in OR  Informed Consent: I have reviewed the patients History and Physical, chart, labs and discussed the procedure including the risks, benefits and alternatives for the proposed anesthesia with the patient or authorized representative who has indicated his/her understanding and acceptance.   Dental advisory given  Plan Discussed with: CRNA and Anesthesiologist  Anesthesia Plan Comments: (Lumbar spondylosis GERD Hypertension  Plan GA with oral ETT  Roberts Gaudy)        Anesthesia Quick Evaluation

## 2014-07-23 NOTE — H&P (Signed)
Subjective: The patient is an 80 year old white female who has complained of back and leg pain consistent with neurogenic claudication. She has failed medical management and was worked up with a lumbar MRI which demonstrated L4-5 spondylolisthesis and spinal stenosis. I discussed the various treatment options with the patient including surgery. She has decided to proceed with surgery.   Past Medical History  Diagnosis Date  . GERD (gastroesophageal reflux disease)   . Arthritis   . Hypertension     Past Surgical History  Procedure Laterality Date  . Abdominal hysterectomy    . Foot surgery      she thinks both....mainly bunions  . Ankle fracture surgery      with titanium plates & screws  right    Allergies  Allergen Reactions  . Ciprofloxacin Palpitations  . Macrodantin [Nitrofurantoin Macrocrystal] Palpitations  . Novocain [Procaine] Palpitations  . Sulfa Antibiotics Palpitations    History  Substance Use Topics  . Smoking status: Former Smoker -- 1.00 packs/day for 38 years  . Smokeless tobacco: Former Systems developer    Quit date: 07/16/1982  . Alcohol Use: 21.0 oz/week    35 Glasses of wine per week     Comment: wine    History reviewed. No pertinent family history. Prior to Admission medications   Medication Sig Start Date End Date Taking? Authorizing Provider  aspirin 81 MG tablet Take 81 mg by mouth daily.   Yes Historical Provider, MD  fosinopril (MONOPRIL) 40 MG tablet Take 40 mg by mouth daily.   Yes Historical Provider, MD  hydrochlorothiazide (HYDRODIURIL) 25 MG tablet Take 25 mg by mouth daily.   Yes Historical Provider, MD  pantoprazole (PROTONIX) 40 MG tablet Take 40 mg by mouth daily.   Yes Historical Provider, MD     Review of Systems  Positive ROS: As above  All other systems have been reviewed and were otherwise negative with the exception of those mentioned in the HPI and as above.  Objective: Vital signs in last 24 hours: Temp:  [98.9 F (37.2 C)] 98.9  F (37.2 C) (10/29 0804) Pulse Rate:  [71] 71 (10/29 0804) Resp:  [18] 18 (10/29 0804) BP: (141)/(69) 141/69 mmHg (10/29 0804) SpO2:  [96 %] 96 % (10/29 0804) Weight:  [58.968 kg (130 lb)] 58.968 kg (130 lb) (10/29 0804)  General Appearance: Alert, cooperative, no distress, Head: Normocephalic, without obvious abnormality, atraumatic Eyes: PERRL, conjunctiva/corneas clear, EOM's intact,    Ears: Normal  Throat: Normal  Neck: Supple, symmetrical, trachea midline, no adenopathy; thyroid: No enlargement/tenderness/nodules; no carotid bruit or JVD Back: Symmetric, no curvature, ROM normal, no CVA tenderness Lungs: Clear to auscultation bilaterally, respirations unlabored Heart: Regular rate and rhythm, no murmur, rub or gallop Abdomen: Soft, non-tender,, no masses, no organomegaly Extremities: Extremities normal, atraumatic, no cyanosis or edema Pulses: 2+ and symmetric all extremities Skin: Skin color, texture, turgor normal, no rashes or lesions  NEUROLOGIC:   Mental status: alert and oriented, no aphasia, good attention span, Fund of knowledge/ memory ok Motor Exam - grossly normal Sensory Exam - grossly normal Reflexes:  Coordination - grossly normal Gait - grossly normal Balance - grossly normal Cranial Nerves: I: smell Not tested  II: visual acuity  OS: Normal  OD: Normal   II: visual fields Full to confrontation  II: pupils Equal, round, reactive to light  III,VII: ptosis None  III,IV,VI: extraocular muscles  Full ROM  V: mastication Normal  V: facial light touch sensation  Normal  V,VII: corneal reflex  Present  VII: facial muscle function - upper  Normal  VII: facial muscle function - lower Normal  VIII: hearing Not tested  IX: soft palate elevation  Normal  IX,X: gag reflex Present  XI: trapezius strength  5/5  XI: sternocleidomastoid strength 5/5  XI: neck flexion strength  5/5  XII: tongue strength  Normal    Data Review Lab Results  Component Value Date    WBC 5.8 07/16/2014   HGB 16.0* 07/23/2014   HCT 47.0* 07/23/2014   MCV 93.0 07/16/2014   PLT 218 07/16/2014   Lab Results  Component Value Date   NA 130* 07/23/2014   K 3.1* 07/23/2014   CL 89* 07/16/2014   CO2 28 07/16/2014   BUN 7 07/16/2014   CREATININE 0.67 07/16/2014   GLUCOSE 101* 07/23/2014   No results found for this basename: INR, PROTIME    Assessment/Plan: L4-5 spondylolisthesis, spinal stenosis, lumbago, lumbar radiculopathy, neurogenic claudication: I have discussed the situation with the patient. I have reviewed her imaging studies with her and pointed out the abnormalities. We have discussed the various treatment options including surgery. I have described the surgical treatment option L4-5 decompression, instrumentation, and fusion. I have shown her surgical models. We have discussed the risks, benefits, alternatives and the likelihood of achieving goals with surgery. I have answered all the patient's questions. She has decided proceed with surgery.   Ayliana Casciano D 07/23/2014 9:53 AM

## 2014-07-23 NOTE — Progress Notes (Signed)
Utilization review completed.  

## 2014-07-24 LAB — BASIC METABOLIC PANEL
Anion gap: 10 (ref 5–15)
BUN: 9 mg/dL (ref 6–23)
CALCIUM: 8.8 mg/dL (ref 8.4–10.5)
CO2: 28 mEq/L (ref 19–32)
Chloride: 92 mEq/L — ABNORMAL LOW (ref 96–112)
Creatinine, Ser: 0.67 mg/dL (ref 0.50–1.10)
GFR calc Af Amer: >90 mL/min (ref >90)
GFR calc non Af Amer: 78 mL/min — ABNORMAL LOW (ref >90)
GLUCOSE: 98 mg/dL (ref 70–99)
Potassium: 3.7 mEq/L (ref 3.7–5.3)
Sodium: 130 mEq/L — ABNORMAL LOW (ref 137–147)

## 2014-07-24 LAB — CBC
HEMATOCRIT: 33.1 % — AB (ref 36.0–46.0)
Hemoglobin: 11.5 g/dL — ABNORMAL LOW (ref 12.0–15.0)
MCH: 32 pg (ref 26.0–34.0)
MCHC: 34.7 g/dL (ref 30.0–36.0)
MCV: 92.2 fL (ref 78.0–100.0)
Platelets: 207 10*3/uL (ref 150–400)
RBC: 3.59 MIL/uL — ABNORMAL LOW (ref 3.87–5.11)
RDW: 12.5 % (ref 11.5–15.5)
WBC: 7.8 10*3/uL (ref 4.0–10.5)

## 2014-07-24 NOTE — Evaluation (Signed)
Physical Therapy Evaluation Patient Details Name: Christina Coffey MRN: [id number] DOB: [birth year] Today's Date: 07/24/2014   History of Present Illness  Patient is a 80 y/o female s/p LUMBAR FOUR-FIVE PLIF, POSTERIOR LATERAL ARTHRODESIS AND POSTERIOR NONSEGMENTAL INSTRUMENTATION POD 1.    Clinical Impression  Patient presents with functional limitations s/p above surgery. Pt with pain and mild balance deficits limiting safe mobility. Reviewed back precautions and gave handout. Some difficulty adhering to precautions during mobility and transfers requiring cues as reminders. Will need to assess ability to use cane next session as pt has 4 point cane at home and was using PTA. Pt would benefit from acute PT to improve transfers, gait, stairs and safety so pt can maximize independence and return to PLOF.    Follow Up Recommendations Supervision for mobility/OOB    Equipment Recommendations  None recommended by PT    Recommendations for Other Services       Precautions / Restrictions Precautions Precautions: Back Precaution Booklet Issued: Yes (comment) Required Braces or Orthoses: Spinal Brace Spinal Brace: Lumbar corset Restrictions Weight Bearing Restrictions: No      Mobility  Bed Mobility Overal bed mobility: Needs Assistance Bed Mobility: Rolling;Sidelying to Sit;Sit to Sidelying Rolling: Supervision Sidelying to sit: Min guard     Sit to sidelying: Min guard General bed mobility comments: VC's for log roll technique and for hand placement. VC's to adhere to back precautions during transfer.  Transfers Overall transfer level: Needs assistance Equipment used: Rolling walker (2 wheeled) Transfers: Sit to/from Stand Sit to Stand: Supervision         General transfer comment: Supervision for safety. Cues to maintain back precautions. transferred on/off toilet Mod I.  Ambulation/Gait Ambulation/Gait assistance: Supervision Ambulation Distance (Feet): 500  Feet Assistive device: Rolling walker (2 wheeled) Gait Pattern/deviations: Step-through pattern;Decreased stride length Gait velocity: decreased   General Gait Details: Steady gait with use of RW; mildly unsteady for short distance without AD. No overt LOB. Cues to maintain precautions and avoid twisting.  Stairs Stairs: Yes Stairs assistance: Supervision Stair Management: Step to pattern;Two rails Number of Stairs: 3 General stair comments: Supervision for safety and cues.   Wheelchair Mobility    Modified Rankin (Stroke Patients Only)       Balance Overall balance assessment: Needs assistance   Sitting balance-Leahy Scale: Good       Standing balance-Leahy Scale: Fair Standing balance comment: able to reach outside BoS to grab lip stick from purse and apply in standing without support. Mildy unsteady during gait without UE support.                             Pertinent Vitals/Pain Pain Assessment: 0-10 Pain Score: 3  Pain Location: back Pain Descriptors / Indicators: Aching;Sore Pain Intervention(s): Monitored during session;Repositioned    Home Living Family/patient expects to be discharged to:: Private residence Living Arrangements: Alone Available Help at Discharge: Family;Available PRN/intermittently Type of Home: House Home Access: Stairs to enter Entrance Stairs-Rails: Right Entrance Stairs-Number of Steps: 4 Home Layout: Two level;Able to live on main level with bedroom/bathroom Home Equipment: Walker - standard;Shower seat;Cane - quad;Bedside commode      Prior Function Level of Independence: Independent with assistive device(s)         Comments: Pt using 4 point cane PTA. More recently using SW prior to surgery for pain control. Pt very active - driving, mowing lawn.     Hand Dominance  Extremity/Trunk Assessment   Upper Extremity Assessment: Overall WFL for tasks assessed           Lower Extremity Assessment:  Overall WFL for tasks assessed         Communication   Communication: No difficulties (mild stuttering noted. )  Cognition Arousal/Alertness: Awake/alert Behavior During Therapy: WFL for tasks assessed/performed Overall Cognitive Status: Within Functional Limits for tasks assessed                      General Comments General comments (skin integrity, edema, etc.): Surgical site- clean, dry and intact.     Exercises        Assessment/Plan    PT Assessment Patient needs continued PT services  PT Diagnosis Acute pain   PT Problem List Pain;Decreased activity tolerance;Decreased balance;Decreased mobility;Decreased knowledge of precautions  PT Treatment Interventions Balance training;Gait training;Stair training;Patient/family education;Functional mobility training;Therapeutic exercise;Therapeutic activities   PT Goals (Current goals can be found in the Care Plan section) Acute Rehab PT Goals Patient Stated Goal: to get better and return home PT Goal Formulation: With patient Time For Goal Achievement: 08/07/14 Potential to Achieve Goals: Good    Frequency Min 5X/week   Barriers to discharge Decreased caregiver support Pt lives alone.    Co-evaluation               End of Session Equipment Utilized During Treatment: Gait belt Activity Tolerance: Patient tolerated treatment well Patient left: in bed;with call bell/phone within reach;with bed alarm set Nurse Communication: Mobility status;Precautions         Time: 1017-1050 PT Time Calculation (min): 33 min   Charges:   PT Evaluation $Initial PT Evaluation Tier I: 1 Procedure PT Treatments $Gait Training: 8-22 mins   PT G CodesCandy Sledge A 07/24/2014, 10:58 AM Candy Sledge, [place], DPT [phone]

## 2014-07-24 NOTE — Progress Notes (Signed)
Nutrition Brief Note  Malnutrition Screening Tool result is inaccurate.  She reports eating well for her. Pt reports having a good appetite PTA and she denies any weight loss. Please consult if nutrition needs are identified.  Pryor Ochoa RD, LDN Inpatient Clinical Dietitian Pager: [phone] After Hours Pager: [phone]

## 2014-07-24 NOTE — Progress Notes (Signed)
Patient ID: Christina Coffey, female   DOB: 03-Jul-1927, 80 y.o.   MRN: [id number] Subjective:  The patient is alert and pleasant. She looks well.  Objective: Vital signs in last 24 hours: Temp:  [97.5 F (36.4 C)-98.9 F (37.2 C)] 97.9 F (36.6 C) (10/30 0606) Pulse Rate:  [62-99] 67 (10/30 0606) Resp:  [12-19] 18 (10/30 0606) BP: (107-160)/(48-76) 107/48 mmHg (10/30 0606) SpO2:  [96 %-100 %] 100 % (10/30 0606) FiO2 (%):  [2 %] 2 % (10/29 1710) Weight:  [58.968 kg (130 lb)] 58.968 kg (130 lb) (10/29 0804)  Intake/Output from previous day: 10/29 0701 - 10/30 0700 In: 1400 [I.V.:1400] Out: 1295 [Urine:855; Drains:265; Blood:175] Intake/Output this shift:    Physical exam the patient is alert and oriented. Her strength is normal in her lower extremities. Her dressing is clean and dry.  Lab Results:  Recent Labs  07/23/14 0803 07/24/14 0539  WBC  --  7.8  HGB 16.0* 11.5*  HCT 47.0* 33.1*  PLT  --  207   BMET  Recent Labs  07/23/14 0803 07/24/14 0539  NA 130* 130*  K 3.1* 3.7  CL  --  92*  CO2  --  28  GLUCOSE 101* 98  BUN  --  9  CREATININE  --  0.67  CALCIUM  --  8.8    Studies/Results: Dg Lumbar Spine 2-3 Views  07/23/2014   CLINICAL DATA:  Post L4-L5 surgery for spondylolisthesis  EXAM: DG C-ARM 61-120 MIN; LUMBAR SPINE - 2-3 VIEW  TECHNIQUE: Two portable views of the lumbar spine submitted.  CONTRAST:  None  FLUOROSCOPY TIME:  18 seconds  COMPARISON:  07/23/2014 single lateral view of the lumbar spine  FINDINGS: Two views of lumbar spine submitted. There is posterior fixation with metallic screws at F8-B0 level. Postsurgical changes with prosthetic disc material noted L4-L5 level. There is anatomic alignment.  IMPRESSION: Posterior metallic fusion at F7-P1 level with anatomic alignment.   Electronically Signed   By: Lahoma Crocker M.D.   On: 07/23/2014 13:35   Dg Lumbar Spine 1 View  07/23/2014   CLINICAL DATA:  L4-L5 PLIF  EXAM: LUMBAR SPINE - 1 VIEW  COMPARISON:   07/02/2014 radiographs, MRI lumbar spine 06/25/2014  FINDINGS: Five lumbar vertebrae labeled on prior MR.  Portable exam #1 at 0258 hr: Metallic probe via dorsal approach projects dorsal to the L5-S1 disc space. Surgical sponge in tissue spreaders project dorsal to the L4-L5 disc space. Bones appear demineralized.  IMPRESSION: Posterior localization of the lower lumbar spine as above.   Electronically Signed   By: Lavonia Dana M.D.   On: 07/23/2014 15:18   Dg C-arm 1-60 Min  07/23/2014   CLINICAL DATA:  Post L4-L5 surgery for spondylolisthesis  EXAM: DG C-ARM 61-120 MIN; LUMBAR SPINE - 2-3 VIEW  TECHNIQUE: Two portable views of the lumbar spine submitted.  CONTRAST:  None  FLUOROSCOPY TIME:  18 seconds  COMPARISON:  07/23/2014 single lateral view of the lumbar spine  FINDINGS: Two views of lumbar spine submitted. There is posterior fixation with metallic screws at N2-D7 level. Postsurgical changes with prosthetic disc material noted L4-L5 level. There is anatomic alignment.  IMPRESSION: Posterior metallic fusion at O2-U2 level with anatomic alignment.   Electronically Signed   By: Lahoma Crocker M.D.   On: 07/23/2014 13:35    Assessment/Plan: Postop day #1: The patient is doing well. We will discontinue her Hemovac. She will likely go home tomorrow. I gave her oral discharge instructions  and answered all her questions.  LOS: 1 day     Christina Coffey D 07/24/2014, 7:53 AM

## 2014-07-25 MED ORDER — HYDROCODONE-ACETAMINOPHEN 5-325 MG PO TABS: 0.5000 | ORAL_TABLET | ORAL | Status: DC | PRN

## 2014-07-25 NOTE — Discharge Summary (Signed)
Physician Discharge Summary  Patient ID: Christina Coffey MRN: [id number] DOB/AGE: 1927/03/02 80 y.o.  Admit date: 07/23/2014 Discharge date: 07/25/2014  Admission Diagnoses:  L4-5 spondylolisthesis, facet arthropathy, Degenerative disc disease, spinal stenosis compressing both the L4 and the L5 nerve roots; lumbago; lumbar radiculopathy  Discharge Diagnoses:  L4-5 spondylolisthesis, facet arthropathy, Degenerative disc disease, spinal stenosis compressing both the L4 and the L5 nerve roots; lumbago; lumbar radiculopathy  Active Problems:   Spondylolisthesis at L4-L5 level  Discharged Condition: good  Hospital Course: Patient was admitted by Dr. Earle Gell, who performed an L4-5 lumbar decompression and stabilization. Postoperatively she has made good progress. She is up and ambulating. She has been seen by PT and OT. Her dressing was removed, and the incision is healing nicely. She is asking to be discharged to home. She has been given instructions regarding wound care and activities by Dr. Arnoldo Morale. She is to return to follow up with him in the office in a few weeks.  Discharge Exam: Blood pressure 116/66, pulse 66, temperature 98.3 F (36.8 C), temperature source Oral, resp. rate 16, height 5\' 1"  (1.549 m), weight 58.968 kg (130 lb), SpO2 100.00%.  Disposition: Home     Medication List         aspirin 81 MG tablet  Take 81 mg by mouth daily.     fosinopril 40 MG tablet  Commonly known as:  MONOPRIL  Take 40 mg by mouth daily.     hydrochlorothiazide 25 MG tablet  Commonly known as:  HYDRODIURIL  Take 25 mg by mouth daily.     HYDROcodone-acetaminophen 5-325 MG per tablet  Commonly known as:  NORCO/VICODIN  Take 0.5-1 tablets by mouth every 4 (four) hours as needed (pain).     pantoprazole 40 MG tablet  Commonly known as:  PROTONIX  Take 40 mg by mouth daily.         Signed: Hosie Spangle, MD 07/25/2014, 8:30 AM

## 2014-07-25 NOTE — Progress Notes (Signed)
Pt is being discharged home with his son. Discharge instructions were given to the patient.

## 2014-07-25 NOTE — Progress Notes (Signed)
Physical Therapy Treatment Patient Details Name: Christina Coffey MRN: [id number] DOB: 11-19-1926 Today's Date: 07/25/2014    History of Present Illness Patient is a 80 y/o female s/p LUMBAR FOUR-FIVE PLIF, POSTERIOR LATERAL ARTHRODESIS AND POSTERIOR NONSEGMENTAL INSTRUMENTATION POD 2.     PT Comments    Patient progressing well with mobility and has met all goals. Able to recall 3/3 back precautions and demonstrate appropriately during mobility and transfers. Tolerated negotiating steps with supervision for safety and ambulating community distances without LOB. Pt reports one of her children will be staying with her for short period at discharge. Pt no longer requires skilled therapy services. Discharge from therapy.   Follow Up Recommendations  Supervision for mobility/OOB;No PT follow up     Equipment Recommendations  None recommended by PT    Recommendations for Other Services       Precautions / Restrictions Precautions Precautions: Back Required Braces or Orthoses: Spinal Brace Spinal Brace: Lumbar corset Restrictions Weight Bearing Restrictions: No    Mobility  Bed Mobility Overal bed mobility: Needs Assistance Bed Mobility: Rolling;Sit to Sidelying Rolling: Modified independent (Device/Increase time)       Sit to sidelying: Modified independent (Device/Increase time) General bed mobility comments: HOB flat, no use of rails to simulate home environment. VC's for log roll technique and to adhere to back precautions during transfer.  Transfers Overall transfer level: Needs assistance Equipment used: Rolling walker (2 wheeled) Transfers: Sit to/from Stand Sit to Stand: Modified independent (Device/Increase time)            Ambulation/Gait Ambulation/Gait assistance: Supervision Ambulation Distance (Feet): 500 Feet Assistive device: Rolling walker (2 wheeled) Gait Pattern/deviations: Step-through pattern;Decreased stride length Gait velocity: decreased    General Gait Details: VC for upright posture. Steady gait with RW.    Stairs Stairs: Yes Stairs assistance: Supervision Stair Management: One rail Right;Step to pattern Number of Stairs: 11 General stair comments: Supervision for safety. Vitals stable.  Wheelchair Mobility    Modified Rankin (Stroke Patients Only)       Balance Overall balance assessment: Needs assistance Sitting-balance support: Feet supported Sitting balance-Leahy Scale: Good Sitting balance - Comments: Able to doff brace sitting EOB without difficulty weight shifting appropriately.   Standing balance support: During functional activity Standing balance-Leahy Scale: Fair Standing balance comment: Able to perform dynamic standing activities without UE support however feels more comfortable with RW for stability.                    Cognition Arousal/Alertness: Awake/alert Behavior During Therapy: WFL for tasks assessed/performed Overall Cognitive Status: Within Functional Limits for tasks assessed                      Exercises      General Comments        Pertinent Vitals/Pain Pain Assessment: 0-10 Pain Score: 2  Pain Location: back Pain Descriptors / Indicators: Sore Pain Intervention(s): Monitored during session;Repositioned    Home Living                      Prior Function            PT Goals (current goals can now be found in the care plan section) Progress towards PT goals: Goals met/education completed, patient discharged from PT    Frequency  Min 5X/week    PT Plan Current plan remains appropriate    Co-evaluation  End of Session Equipment Utilized During Treatment: Gait belt Activity Tolerance: Patient tolerated treatment well Patient left: in bed;with call bell/phone within reach;with bed alarm set     Time: 7998-0012 PT Time Calculation (min): 25 min  Charges:  $Gait Training: 8-22 mins $Therapeutic Activity: 8-22  mins                    G CodesCandy Sledge A 07/28/2014, 10:05 AM Candy Sledge, [place], DPT [phone]

## 2014-09-23 ENCOUNTER — Ambulatory Visit: Payer: Self-pay | Admitting: Internal Medicine

## 2014-12-09 ENCOUNTER — Ambulatory Visit: Payer: Self-pay | Admitting: Neurosurgery

## 2014-12-15 ENCOUNTER — Ambulatory Visit: Payer: Self-pay | Admitting: Internal Medicine

## 2015-06-24 ENCOUNTER — Other Ambulatory Visit: Payer: Self-pay | Admitting: Specialist

## 2015-06-24 DIAGNOSIS — R918 Other nonspecific abnormal finding of lung field: Secondary | ICD-10-CM

## 2015-06-30 ENCOUNTER — Ambulatory Visit
Admission: RE | Admit: 2015-06-30 | Discharge: 2015-06-30 | Disposition: A | Discharge status: Home / Self Care | Payer: Medicare Other | Source: Ambulatory Visit | Attending: Specialist | Admitting: Specialist

## 2015-06-30 DIAGNOSIS — J479 Bronchiectasis, uncomplicated: Secondary | ICD-10-CM | POA: Diagnosis not present

## 2015-06-30 DIAGNOSIS — R599 Enlarged lymph nodes, unspecified: Secondary | ICD-10-CM | POA: Diagnosis not present

## 2015-06-30 DIAGNOSIS — R911 Solitary pulmonary nodule: Secondary | ICD-10-CM | POA: Diagnosis not present

## 2015-06-30 DIAGNOSIS — R918 Other nonspecific abnormal finding of lung field: Secondary | ICD-10-CM | POA: Diagnosis present

## 2015-08-02 ENCOUNTER — Other Ambulatory Visit: Payer: Self-pay | Admitting: Internal Medicine

## 2015-08-02 DIAGNOSIS — Z1231 Encounter for screening mammogram for malignant neoplasm of breast: Secondary | ICD-10-CM

## 2015-08-09 ENCOUNTER — Institutional Professional Consult (permissible substitution): Payer: Medicare Other | Admitting: Internal Medicine

## 2015-09-28 ENCOUNTER — Ambulatory Visit
Admission: RE | Admit: 2015-09-28 | Discharge: 2015-09-28 | Disposition: A | Discharge status: Home / Self Care | Payer: Medicare Other | Source: Ambulatory Visit | Attending: Internal Medicine | Admitting: Internal Medicine

## 2015-09-28 DIAGNOSIS — Z1231 Encounter for screening mammogram for malignant neoplasm of breast: Secondary | ICD-10-CM | POA: Insufficient documentation

## 2016-01-14 ENCOUNTER — Encounter: Payer: Self-pay | Admitting: *Deleted

## 2016-01-14 ENCOUNTER — Emergency Department
Admission: EM | Admit: 2016-01-14 | Discharge: 2016-01-14 | Disposition: A | Discharge status: Home / Self Care | Payer: Medicare Other | Attending: Emergency Medicine | Admitting: Emergency Medicine

## 2016-01-14 DIAGNOSIS — Z87891 Personal history of nicotine dependence: Secondary | ICD-10-CM | POA: Insufficient documentation

## 2016-01-14 DIAGNOSIS — R55 Syncope and collapse: Secondary | ICD-10-CM | POA: Diagnosis present

## 2016-01-14 DIAGNOSIS — E860 Dehydration: Secondary | ICD-10-CM | POA: Diagnosis not present

## 2016-01-14 DIAGNOSIS — I10 Essential (primary) hypertension: Secondary | ICD-10-CM | POA: Insufficient documentation

## 2016-01-14 LAB — BASIC METABOLIC PANEL
Anion gap: 12 (ref 5–15)
BUN: 12 mg/dL (ref 6–20)
CHLORIDE: 90 mmol/L — AB (ref 101–111)
CO2: 24 mmol/L (ref 22–32)
Calcium: 8.9 mg/dL (ref 8.9–10.3)
Creatinine, Ser: 0.61 mg/dL (ref 0.44–1.00)
GFR calc non Af Amer: >60 mL/min (ref >60)
Glucose, Bld: 117 mg/dL — ABNORMAL HIGH (ref 65–99)
POTASSIUM: 3.2 mmol/L — AB (ref 3.5–5.1)
SODIUM: 126 mmol/L — AB (ref 135–145)

## 2016-01-14 LAB — URINALYSIS COMPLETE WITH MICROSCOPIC (ARMC ONLY)
BACTERIA UA: NONE SEEN
Bilirubin Urine: NEGATIVE
Glucose, UA: NEGATIVE mg/dL
HGB URINE DIPSTICK: NEGATIVE
LEUKOCYTES UA: NEGATIVE
Nitrite: NEGATIVE
PH: 9 — AB (ref 5.0–8.0)
Protein, ur: 30 mg/dL — AB
Specific Gravity, Urine: 1.015 (ref 1.005–1.030)

## 2016-01-14 LAB — CBC
HEMATOCRIT: 39.1 % (ref 35.0–47.0)
Hemoglobin: 13.3 g/dL (ref 12.0–16.0)
MCH: 32.3 pg (ref 26.0–34.0)
MCHC: 34.1 g/dL (ref 32.0–36.0)
MCV: 94.8 fL (ref 80.0–100.0)
Platelets: 225 10*3/uL (ref 150–440)
RBC: 4.13 MIL/uL (ref 3.80–5.20)
RDW: 13 % (ref 11.5–14.5)
WBC: 6.1 10*3/uL (ref 3.6–11.0)

## 2016-01-14 MED ORDER — SODIUM CHLORIDE 0.9 % IV BOLUS (SEPSIS)
1000.0000 mL | Freq: Once | INTRAVENOUS | Status: AC
  Administered 2016-01-14: 1000 mL via INTRAVENOUS

## 2016-01-14 NOTE — ED Notes (Signed)
Patient stable and ambulatory.  Verbalized understanding of discharge instructions.

## 2016-01-14 NOTE — Discharge Instructions (Signed)
Please seek medical attention for any high fevers, chest pain, shortness of breath, change in behavior, persistent vomiting, bloody stool or any other new or concerning symptoms.   Dehydration, Adult Dehydration means your body does not have as much fluid or water as it needs. It happens when you take in less fluid than you lose. Your kidneys, brain, and heart will not work properly without the right amount of fluids.  Dehydration can range from mild to severe. It should be treated right away to help prevent it from becoming severe. HOME CARE  Drink enough fluid to keep your pee (urine) clear or pale yellow.  Drink water or fluid slowly by taking small sips. You can also try sucking on ice cubes.  Have food or drinks that contain electrolytes. Examples include bananas and sports drinks.  Take over-the-counter and prescription medicines only as told by your doctor.  Prepare oral rehydration solution (ORS) according to the instructions that came with it. Take sips of ORS every 5 minutes until your pee returns to normal.  If you are throwing up (vomiting) or have watery poop (diarrhea), keep trying to drink water, ORS, or both.  If you have watery poop, avoid:  Drinks with caffeine.  Fruit juice.  Milk.  Carbonated soft drinks.  Do not take salt tablets. This can lead to having too much sodium in your body (hypernatremia). GET HELP IF:  You cannot eat or drink without throwing up.  You have had mild watery poop for longer than 24 hours.  You have a fever. GET HELP RIGHT AWAY IF:   You have very strong thirst.  You have very bad watery poop.  You have not peed in 6-8 hours, or you have peed only a small amount of very dark pee.  You have shriveled skin.  You are dizzy, confused, or both.   This information is not intended to replace advice given to you by your health care provider. Make sure you discuss any questions you have with your health care provider.   Document  Released: 07/08/2009 Document Revised: 06/02/2015 Document Reviewed: 01/27/2015 Elsevier Interactive Patient Education [employer].

## 2016-01-14 NOTE — ED Notes (Signed)
Pt ambulatory to triage.  Pt reports working in the yard today and feeling like she was going to pass out.  States i feel dehydrated.  Pt alert.  No chest pain or sob.

## 2016-01-14 NOTE — ED Provider Notes (Signed)
[facility] Emergency Department Provider Note    ____________________________________________  Time seen: *~1915  I have reviewed the triage vital signs and the nursing notes.   HISTORY  Chief Complaint Dehydration  History limited by: Not Limited   HPI Christina Coffey is a 82 y.o. female who presents to the emergency department today because of concerns for dehydration. The patient states that she did have some diarrhea early this morning. She then went and worked outside in the heat. She felt like she knew she was getting dehydrated. The patient states that this has happened to her before. She states that she gets a little dizzy and near syncopal. She denies any chest pain or shortness of breath with this. Denies any recent fevers.   Past Medical History  Diagnosis Date  . GERD (gastroesophageal reflux disease)   . Arthritis   . Hypertension     Patient Active Problem List   Diagnosis Date Noted  . Spondylolisthesis at L4-L5 level 07/23/2014    Past Surgical History  Procedure Laterality Date  . Abdominal hysterectomy    . Foot surgery      she thinks both....mainly bunions  . Ankle fracture surgery      with titanium plates & screws  right    Current Outpatient Rx  Name  Route  Sig  Dispense  Refill  . aspirin 81 MG tablet   Oral   Take 81 mg by mouth daily.         . fosinopril (MONOPRIL) 40 MG tablet   Oral   Take 40 mg by mouth daily.         . hydrochlorothiazide (HYDRODIURIL) 25 MG tablet   Oral   Take 25 mg by mouth daily.         Marland Kitchen HYDROcodone-acetaminophen (NORCO/VICODIN) 5-325 MG per tablet   Oral   Take 0.5-1 tablets by mouth every 4 (four) hours as needed (pain).   50 tablet   0   . pantoprazole (PROTONIX) 40 MG tablet   Oral   Take 40 mg by mouth daily.           Allergies Ciprofloxacin; Macrodantin; Novocain; and Sulfa antibiotics  Family History  Problem Relation Age of Onset  . Breast cancer Neg  Hx     Social History Social History  Substance Use Topics  . Smoking status: Former Smoker -- 1.00 packs/day for 38 years  . Smokeless tobacco: Former Systems developer    Quit date: 07/16/1982  . Alcohol Use: 21.0 oz/week    35 Glasses of wine per week     Comment: wine    Review of Systems  Constitutional: Negative for fever. Cardiovascular: Negative for chest pain. Respiratory: Negative for shortness of breath. Gastrointestinal: Negative for abdominal pain, vomiting and diarrhea. Neurological: Negative for headaches, focal weakness or numbness.  10-point ROS otherwise negative.  ____________________________________________   PHYSICAL EXAM:  VITAL SIGNS: ED Triage Vitals  Enc Vitals Group     BP 01/14/16 1550 201/91 mmHg     Pulse Rate 01/14/16 1550 72     Resp 01/14/16 1550 18     Temp 01/14/16 1550 98 F (36.7 C)     Temp Source 01/14/16 1550 Oral     SpO2 01/14/16 1550 99 %     Weight 01/14/16 1550 129 lb (58.514 kg)     Height 01/14/16 1550 '5\' 1"'$  (1.549 m)   Constitutional: Alert and oriented. Well appearing and in no distress. Eyes: Conjunctivae are  normal. PERRL. Normal extraocular movements. ENT   Head: Normocephalic and atraumatic.   Nose: No congestion/rhinnorhea.   Mouth/Throat: Mucous membranes are moist.   Neck: No stridor. Hematological/Lymphatic/Immunilogical: No cervical lymphadenopathy. Cardiovascular: Normal rate, regular rhythm.  No murmurs, rubs, or gallops. Respiratory: Normal respiratory effort without tachypnea nor retractions. Breath sounds are clear and equal bilaterally. No wheezes/rales/rhonchi. Gastrointestinal: Soft and nontender. No distention. There is no CVA tenderness. Genitourinary: Deferred Musculoskeletal: Normal range of motion in all extremities. No joint effusions.  No lower extremity tenderness nor edema. Neurologic:  Normal speech and language. No gross focal neurologic deficits are appreciated.  Skin:  Skin is warm,  dry and intact. No rash noted. Psychiatric: Mood and affect are normal. Speech and behavior are normal. Patient exhibits appropriate insight and judgment.  ____________________________________________    LABS (pertinent positives/negatives)  Labs Reviewed  BASIC METABOLIC PANEL - Abnormal; Notable for the following:    Sodium 126 (*)    Potassium 3.2 (*)    Chloride 90 (*)    Glucose, Bld 117 (*)    All other components within normal limits  URINALYSIS COMPLETEWITH MICROSCOPIC ([facility] ONLY) - Abnormal; Notable for the following:    Color, Urine YELLOW (*)    APPearance CLEAR (*)    Ketones, ur TRACE (*)    pH 9.0 (*)    Protein, ur 30 (*)    Squamous Epithelial / LPF 0-5 (*)    All other components within normal limits  CBC     ____________________________________________   EKG  I, Nance Pear, attending physician, personally viewed and interpreted this EKG  EKG Time: 1603 Rate: 68 Rhythm: normal sinus rhythm Axis: normal Intervals: qtc 482 QRS: RBBB ST changes: no st elevation Impression: abnormal ekg  ____________________________________________    RADIOLOGY  None  ____________________________________________   PROCEDURES  Procedure(s) performed: None  Critical Care performed: No  ____________________________________________   INITIAL IMPRESSION / ASSESSMENT AND PLAN / ED COURSE  Pertinent labs & imaging results that were available during my care of the patient were reviewed by me and considered in my medical decision making (see chart for details).  This presented to the emergency department today because of concerns for dehydration and some dizziness. Lab work is consistent with some dehydration with hyponatremia and low chloride. She typically does run slightly low on her sodium. Patient was given 1 L of IV fluids. She stated she did feel better after that. Do not think that there was a cardiac etiology for the dizziness given lack of chest  pain. Will discharge him follow-up with primary complaint  ____________________________________________   FINAL CLINICAL IMPRESSION(S) / ED DIAGNOSES  Final diagnoses:  Dehydration     Nance Pear, MD 01/14/16 2108

## 2016-01-14 NOTE — ED Notes (Signed)
Pt in via triage with complaints of an episode of nausea/dizziness today while working out in the yard.  Pt denies any vomiting, pt reports diarrhea x 1 week.  Pt states, "I think I am dehydrated."  Pt A/Ox3, hypertensive, other vitals WDL.

## 2016-01-15 ENCOUNTER — Emergency Department
Admission: EM | Admit: 2016-01-15 | Discharge: 2016-01-15 | Disposition: A | Discharge status: Home / Self Care | Payer: Medicare Other | Attending: Emergency Medicine | Admitting: Emergency Medicine

## 2016-01-15 ENCOUNTER — Encounter: Payer: Self-pay | Admitting: Emergency Medicine

## 2016-01-15 DIAGNOSIS — I159 Secondary hypertension, unspecified: Secondary | ICD-10-CM | POA: Diagnosis not present

## 2016-01-15 DIAGNOSIS — Z79899 Other long term (current) drug therapy: Secondary | ICD-10-CM | POA: Diagnosis not present

## 2016-01-15 DIAGNOSIS — Z7982 Long term (current) use of aspirin: Secondary | ICD-10-CM | POA: Diagnosis not present

## 2016-01-15 DIAGNOSIS — Z87891 Personal history of nicotine dependence: Secondary | ICD-10-CM | POA: Insufficient documentation

## 2016-01-15 DIAGNOSIS — Z90710 Acquired absence of both cervix and uterus: Secondary | ICD-10-CM | POA: Diagnosis not present

## 2016-01-15 DIAGNOSIS — M1990 Unspecified osteoarthritis, unspecified site: Secondary | ICD-10-CM | POA: Diagnosis not present

## 2016-01-15 DIAGNOSIS — I10 Essential (primary) hypertension: Secondary | ICD-10-CM | POA: Diagnosis present

## 2016-01-15 NOTE — Discharge Instructions (Signed)
Please seek medical attention for any high fevers, chest pain, shortness of breath, change in behavior, persistent vomiting, bloody stool or any other new or concerning symptoms. ° ° °Hypertension °Hypertension is another name for high blood pressure. High blood pressure forces your heart to work harder to pump blood. A blood pressure reading has two numbers, which includes a higher number over a lower number (example: 110/72). °HOME CARE  °· Have your blood pressure rechecked by your doctor. °· Only take medicine as told by your doctor. Follow the directions carefully. The medicine does not work as well if you skip doses. Skipping doses also puts you at risk for problems. °· Do not smoke. °· Monitor your blood pressure at home as told by your doctor. °GET HELP IF: °· You think you are having a reaction to the medicine you are taking. °· You have repeat headaches or feel dizzy. °· You have puffiness (swelling) in your ankles. °· You have trouble with your vision. °GET HELP RIGHT AWAY IF:  °· You get a very bad headache and are confused. °· You feel weak, numb, or faint. °· You get chest or belly (abdominal) pain. °· You throw up (vomit). °· You cannot breathe very well. °MAKE SURE YOU:  °· Understand these instructions. °· Will watch your condition. °· Will get help right away if you are not doing well or get worse. °  °This information is not intended to replace advice given to you by your health care provider. Make sure you discuss any questions you have with your health care provider. °  °Document Released: 02/28/2008 Document Revised: 09/16/2013 Document Reviewed: 07/04/2013 °Elsevier Interactive Patient Education ©2016 [employer] ° °

## 2016-01-15 NOTE — ED Notes (Signed)
Spoke with Dr. Archie Balboa who saw patient yesterday.  At this time, patient does not need any protocols started or EKG since she asymptomatic at this time. Patient ambulatory back out the lobby to wait for bed assignment.

## 2016-01-15 NOTE — ED Notes (Signed)
Reviewed d/c instructions, follow-up care with pt. Pt verbalized understanding 

## 2016-01-15 NOTE — ED Notes (Signed)
MD Archie Balboa at bedside.

## 2016-01-15 NOTE — ED Notes (Signed)
Patient states her BP at home that she randomly checked was 181/91.  Patient here last night for the same thing.  Patient is on BP medications which she took this morning.  Patient denies any other symptoms. Recently changed from ACEI due to cough.

## 2016-01-15 NOTE — ED Provider Notes (Signed)
[facility] Emergency Department Provider Note   ____________________________________________  Time seen: ~1830  I have reviewed the triage vital signs and the nursing notes.   HISTORY  Chief Complaint Hypertension   History limited by: Not Limited   HPI Christina Coffey is a 82 y.o. female who presents to the emergency department today because of concerns for elevated blood pressure. I did evaluate the patient yesterday. She states today she checked her blood pressure noticed that it was elevated. Systolic in the 195K. She denies any symptoms with this. She denies any headache or chest pain. No palpitations. The patient stated that she was concerned about going to bed with this elevation of blood pressure. She denies any recent fevers.    Past Medical History  Diagnosis Date  . GERD (gastroesophageal reflux disease)   . Arthritis   . Hypertension     Patient Active Problem List   Diagnosis Date Noted  . Spondylolisthesis at L4-L5 level 07/23/2014    Past Surgical History  Procedure Laterality Date  . Abdominal hysterectomy    . Foot surgery      she thinks both....mainly bunions  . Ankle fracture surgery      with titanium plates & screws  right    Current Outpatient Rx  Name  Route  Sig  Dispense  Refill  . aspirin 81 MG tablet   Oral   Take 81 mg by mouth daily.         . fosinopril (MONOPRIL) 40 MG tablet   Oral   Take 40 mg by mouth daily.         . hydrochlorothiazide (HYDRODIURIL) 25 MG tablet   Oral   Take 25 mg by mouth daily.         Marland Kitchen HYDROcodone-acetaminophen (NORCO/VICODIN) 5-325 MG per tablet   Oral   Take 0.5-1 tablets by mouth every 4 (four) hours as needed (pain).   50 tablet   0   . pantoprazole (PROTONIX) 40 MG tablet   Oral   Take 40 mg by mouth daily.           Allergies Ace inhibitors; Ciprofloxacin; Macrodantin; Novocain; and Sulfa antibiotics  Family History  Problem Relation Age of Onset  .  Breast cancer Neg Hx     Social History Social History  Substance Use Topics  . Smoking status: Former Smoker -- 1.00 packs/day for 38 years  . Smokeless tobacco: Former Systems developer    Quit date: 07/16/1982  . Alcohol Use: 21.0 oz/week    35 Glasses of wine per week     Comment: wine    Review of Systems  Constitutional: Negative for fever. Cardiovascular: Negative for chest pain. Respiratory: Negative for shortness of breath. Gastrointestinal: Negative for abdominal pain, vomiting and diarrhea. Neurological: Negative for headaches, focal weakness or numbness.  10-point ROS otherwise negative.  ____________________________________________   PHYSICAL EXAM:  VITAL SIGNS: ED Triage Vitals  Enc Vitals Group     BP 01/15/16 1737 185/75 mmHg     Pulse Rate 01/15/16 1737 67     Resp 01/15/16 1737 18     Temp 01/15/16 1737 97.8 F (36.6 C)     Temp Source 01/15/16 1737 Oral     SpO2 01/15/16 1737 96 %     Weight 01/15/16 1737 129 lb (58.514 kg)     Height 01/15/16 1737 '5\' 1"'$  (1.549 m)     Head Cir --      Peak Flow --  Pain Score 01/15/16 1738 0   Constitutional: Alert and oriented. Well appearing and in no distress. Eyes: Conjunctivae are normal. PERRL. Normal extraocular movements. ENT   Head: Normocephalic and atraumatic.   Nose: No congestion/rhinnorhea.   Mouth/Throat: Mucous membranes are moist.   Neck: No stridor. Hematological/Lymphatic/Immunilogical: No cervical lymphadenopathy. Cardiovascular: Normal rate, regular rhythm.  No murmurs, rubs, or gallops. Respiratory: Normal respiratory effort without tachypnea nor retractions. Breath sounds are clear and equal bilaterally. No wheezes/rales/rhonchi. Gastrointestinal: Soft and nontender. No distention.  Genitourinary: Deferred Musculoskeletal: Normal range of motion in all extremities. No joint effusions.  No lower extremity tenderness nor edema. Neurologic:  Normal speech and language. No gross focal  neurologic deficits are appreciated.  Skin:  Skin is warm, dry and intact. No rash noted. Psychiatric: Mood and affect are normal. Speech and behavior are normal. Patient exhibits appropriate insight and judgment.  ____________________________________________    LABS (pertinent positives/negatives)  None  ____________________________________________   EKG  None  ____________________________________________    RADIOLOGY  None  ____________________________________________   PROCEDURES  Procedure(s) performed: None  Critical Care performed: No  ____________________________________________   INITIAL IMPRESSION / ASSESSMENT AND PLAN / ED COURSE  Pertinent labs & imaging results that were available during my care of the patient were reviewed by me and considered in my medical decision making (see chart for details).  Patient presented to the emergency department today because of concerns for high blood pressure. I did have discussion with the patient about asymptomatic blood pressure. At this point patient was a headache or chest pain. I discussed with the patient her concern about possible stroke that I thought it would be unlikely given her blood pressure. I did however discuss with the patient return precautions and headache and chest pain. I did offer to repeat blood test today however patient declined. Discussed with the patient importance of following up with primary care on Monday.  ____________________________________________   FINAL CLINICAL IMPRESSION(S) / ED DIAGNOSES  Final diagnoses:  Secondary hypertension, unspecified     Nance Pear, MD 01/15/16 1540

## 2016-01-25 ENCOUNTER — Other Ambulatory Visit: Payer: Self-pay | Admitting: Specialist

## 2016-01-25 DIAGNOSIS — R9389 Abnormal findings on diagnostic imaging of other specified body structures: Secondary | ICD-10-CM

## 2016-02-01 ENCOUNTER — Ambulatory Visit
Admission: RE | Admit: 2016-02-01 | Discharge: 2016-02-01 | Disposition: A | Discharge status: Home / Self Care | Payer: Medicare Other | Source: Ambulatory Visit | Attending: Specialist | Admitting: Specialist

## 2016-02-01 DIAGNOSIS — R938 Abnormal findings on diagnostic imaging of other specified body structures: Secondary | ICD-10-CM | POA: Diagnosis present

## 2016-02-01 DIAGNOSIS — R911 Solitary pulmonary nodule: Secondary | ICD-10-CM | POA: Diagnosis not present

## 2016-02-01 DIAGNOSIS — J479 Bronchiectasis, uncomplicated: Secondary | ICD-10-CM | POA: Diagnosis not present

## 2016-02-01 DIAGNOSIS — R9389 Abnormal findings on diagnostic imaging of other specified body structures: Secondary | ICD-10-CM

## 2016-02-29 ENCOUNTER — Encounter: Payer: Self-pay | Admitting: Internal Medicine

## 2016-02-29 ENCOUNTER — Encounter (INDEPENDENT_AMBULATORY_CARE_PROVIDER_SITE_OTHER): Payer: Self-pay

## 2016-02-29 ENCOUNTER — Ambulatory Visit (INDEPENDENT_AMBULATORY_CARE_PROVIDER_SITE_OTHER): Payer: Medicare Other | Admitting: Internal Medicine

## 2016-02-29 VITALS — BP 112/78 | HR 57 | Ht 61.0 in | Wt 124.0 lb

## 2016-02-29 DIAGNOSIS — J479 Bronchiectasis, uncomplicated: Secondary | ICD-10-CM | POA: Diagnosis not present

## 2016-02-29 NOTE — Progress Notes (Signed)
[facility] Consultation      Date: 02/29/2016,   MRN# [id number] Christina Coffey 08-10-1927 Code Status:  Code Status History    Date Active Date Inactive Code Status Order ID Comments User Context   07/23/2014  3:29 PM 07/25/2014  3:32 PM Full Code [id number]  Newman Pies, MD Inpatient     [facility] day:'@LENGTHOFSTAYDAYS'$ @ Referring MD: '@ATDPROV'$ @     PCP:      AdmissionWeight: 124 lb (56.246 kg)                 CurrentWeight: 124 lb (56.246 kg) Christina Coffey is a 82 y.o. old female seen in consultation for chronic cough at the request of Dr. Doy Hutching     CHIEF COMPLAINT:   Mild cough    HISTORY OF PRESENT ILLNESS  82 yo very active white female seen today for mild chronic cough for approx 1.5 years. It is associated with prodcutive cough with white phlegm   Over the past 1.5 years. Patient states that she is very active, works in the yard, she has no SOB no WOB. No chest pain. She denies having fevers, chills, NVD.  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 She states that she has never had been dx with pneumonia, and she has not been on ABX for a long time Her previous CT scans did mention increasing opacification however,  patient does NOT have any resp symptoms at this time  Supposedly, she underwent Bronch with biopsy in Oct 2015 no report found, suggests benign process  I have reviewed CT scans since 2011, the most recenty Ct chest 01/2016 reveals increased LLL opacity      PAST MEDICAL HISTORY   Past Medical History  Diagnosis Date  . GERD (gastroesophageal reflux disease)   . Arthritis   . Hypertension      SURGICAL HISTORY    Past Surgical History  Procedure Laterality Date  . Abdominal hysterectomy    . Foot surgery      she thinks both....mainly bunions  . Ankle fracture surgery      with titanium plates & screws  right  . Back surgery       FAMILY HISTORY   Family History  Problem Relation Age of Onset  . Breast cancer Neg Hx      SOCIAL HISTORY   Social History  Substance Use Topics  . Smoking status: Former Smoker -- 1.00 packs/day for 38 years  . Smokeless tobacco: Former Systems developer    Quit date: 07/16/1982  . Alcohol Use: 21.0 oz/week    35 Glasses of wine per week     Comment: wine     MEDICATIONS    Home Medication:  Current Outpatient Rx  Name  Route  Sig  Dispense  Refill  . aspirin 81 MG tablet   Oral   Take 81 mg by mouth daily.         . hydrochlorothiazide (HYDRODIURIL) 25 MG tablet   Oral   Take 25 mg by mouth daily.         Marland Kitchen losartan (COZAAR) 100 MG tablet   Oral   Take 100 mg  by mouth daily.         . pantoprazole (PROTONIX) 40 MG tablet   Oral   Take 40 mg by mouth daily.           Current Medication:  Current outpatient prescriptions:  .  aspirin 81 MG tablet, Take 81 mg by mouth daily., Disp: , Rfl:  .  hydrochlorothiazide (HYDRODIURIL) 25 MG tablet, Take 25 mg by mouth daily., Disp: , Rfl:  .  losartan (COZAAR) 100 MG tablet, Take 100 mg by mouth daily., Disp: , Rfl:  .  pantoprazole (PROTONIX) 40 MG tablet, Take 40 mg by mouth daily., Disp: , Rfl:     ALLERGIES   Ace inhibitors; Ciprofloxacin; Macrodantin; Novocain; and Sulfa antibiotics     REVIEW OF SYSTEMS   Review of Systems  Constitutional: Negative for fever, chills, weight loss and malaise/fatigue.  HENT: Negative for congestion and hearing loss.   Eyes: Negative for blurred vision and double vision.  Respiratory: Positive for cough and sputum production. Negative for hemoptysis, shortness of breath and wheezing.   Cardiovascular: Negative for chest pain, palpitations,  orthopnea and leg swelling.  Gastrointestinal: Negative for heartburn, nausea, vomiting and abdominal pain.  Genitourinary: Negative for dysuria and urgency.  Musculoskeletal: Negative for myalgias, back pain and neck pain.  Skin: Negative for rash.  Neurological: Negative for dizziness, tingling, tremors and headaches.  Endo/Heme/Allergies: Does not bruise/bleed easily.  Psychiatric/Behavioral: Negative for depression, suicidal ideas and substance abuse.  All other systems reviewed and are negative.    VS: Ht '5\' 1"'$  (1.549 m)  Wt 124 lb (56.246 kg)  BMI 23.44 kg/m2     PHYSICAL EXAM  Physical Exam  Constitutional: She is oriented to person, place, and time. She appears well-developed and well-nourished. No distress.  HENT:  Head: Normocephalic and atraumatic.  Mouth/Throat: No oropharyngeal exudate.  Eyes: EOM are normal. Pupils are equal, round, and reactive to light. No scleral icterus.  Neck: Normal range of motion. Neck supple.  Cardiovascular: Normal rate, regular rhythm and normal heart sounds.   No murmur heard. Pulmonary/Chest: No stridor. No respiratory distress. She has no wheezes. She has rales.  Abdominal: Soft. Bowel sounds are normal.  Musculoskeletal: Normal range of motion. She exhibits no edema.  Neurological: She is alert and oriented to person, place, and time. No cranial nerve deficit.  Skin: Skin is warm. She is not diaphoretic.  Psychiatric: She has a normal mood and affect.          IMAGING    Ct Chest Wo Contrast  02/01/2016  CLINICAL DATA:  Followup left lower lobe infiltrate. EXAM: CT CHEST WITHOUT CONTRAST TECHNIQUE: Multidetector CT imaging of the chest was performed following the standard protocol without IV contrast. COMPARISON:  Chest CT 06/30/2015 FINDINGS: Mediastinum/Nodes: No breast masses, supraclavicular or axillary lymphadenopathy. Small scattered lymph nodes are stable. The heart is normal in size. No pericardial effusion. Stable  advanced atherosclerotic calcifications involving the aorta and coronary arteries. Minimal no mediastinal or hilar mass or lymphadenopathy. Small scattered lymph nodes are unchanged. The esophagus is grossly normal. Small hiatal hernia. Lungs/Pleura: Persistent dense airspace consolidation in the left lower lobe with atelectasis, volume loss and bronchiectasis. No discrete lung abscess. No obvious lung mass. This could be chronic indolent infection. Small patchy 7 airspace opacities are slightly larger but also have the appearance of airspace disease/ infection and not pulmonary lesions. Stable 3 mm right upper lobe pulmonary nodule on image number 27. No other pulmonary lesions. Small focal  area of atelectasis overlying the right-sided diaphragmatic hernia. Upper abdomen: No significant upper abdominal findings. Musculoskeletal: No significant bony findings. Stable degenerative changes involving the spine. IMPRESSION: 1. Chronic left lower lobe airspace process, bronchiectasis and atelectasis. Recommend pulmonary consultation. Surrounding airspace nodules likely also chronic infection. 2. Stable 3 mm right upper lobe pulmonary nodule. 3. No mediastinal or hilar mass or adenopathy. Electronically Signed   By: Marijo Sanes M.D.   On: 02/01/2016 17:06    CT chest reveiwed since 2011 images reveiwed 02/29/2016 06/20/10 09/20/10 10/16/13 11/25/13 12/15/14 06/30/15 02/01/2016     ASSESSMENT/PLAN   82 yo white female with extensive envireonemental exposure to [place] and spray paints/porcelin with bronchiectasis based on CT chest findings as well as a persistent LLL opacity Likely chronic inflammation since 2011 without signs of infection at this time  AT this time, patient does NOT want any further work up at this time, she does NOT want Bronch or biopsy at this time. She is very active and works in yard daily, there are no respiratory of acute of cronic respiratory that is affecting her lifestyle. There is no  sign of infection at this  Time  I have advised patient to call if symptoms change, whi will need bronch with Bx or evene Lung biopsy for definitive dx, but will NOT pursue at this time.   Follow up in 3 months   I have personally obtained a history, examined the patient, evaluated laboratory and independently reviewed imaging results, formulated the assessment and plan and placed orders.  The Patient requires high complexity decision making for assessment and support, frequent evaluation and titration of therapies, application of advanced monitoring technologies and extensive interpretation of multiple databases.   Patient  satisfied with Plan of action and management. All questions answered  Corrin Parker, M.D.  [facility]  Medical Director [facility] Director [facility] Cardio-Pulmonary Department

## 2016-02-29 NOTE — Patient Instructions (Signed)
Bronchiectasis Bronchiectasis is a condition in which the airways (bronchi) are damaged and widened. This makes it difficult for the lungs to get rid of mucus. As a result, mucus gathers in the airways, and this often leads to lung infections. Infection can cause inflammation in the airways, which may further weaken and damage the bronchi.  CAUSES  Bronchiectasis may be present at birth (congenital) or may develop later in life. Sometimes there is no apparent cause. Some common causes include:  Cystic fibrosis.   Recurrent lung infections (such as pneumonia, tuberculosis, or fungal infections).  Foreign bodies or other blockages in the lungs.  Breathing in fluid, food, or other foreign objects (aspiration). SIGNS AND SYMPTOMS  Common symptoms include:  A daily cough that brings up mucus and lasts for more than 3 weeks.  Frequent lung infections (such as pneumonia, tuberculosis, or fungal infections).  Shortness of breath and wheezing.   Weakness and fatigue. DIAGNOSIS  Various tests may be done to help diagnose bronchiectasis. Tests may include:  Chest X-rays or CT scans.   Breathing tests to help determine how your lungs are working.   Sputum cultures to check for infection.   Blood tests and other tests to check for related diseases or causes, such as cystic fibrosis. TREATMENT  Treatment varies depending on the severity of the condition. Medicines may be given to loosen the mucus to be coughed up (expectorants), to relax the muscles of the air passages (bronchodilators), or to prevent or treat infections (antibiotics). Physical therapy methods may be recommended to help clear mucus from the lungs. For severe cases, surgery may be done to remove the affected part of the lung. HOME CARE INSTRUCTIONS   Get plenty of rest.   Only take over-the-counter or prescription medicines as directed by your health care provider. If antibiotic medicines were prescribed, take them as  directed. Finish them even if you start to feel better.  Avoid sedatives and antihistamines unless otherwise directed by your health care provider. These medicines tend to thicken the mucus in the lungs.   Perform any breathing exercises or techniques to clear the lungs as directed by your health care provider.  Drink enough fluids to keep your urine clear or pale yellow.  Consider using a cold steam vaporizer or humidifier in your room or home to help loosen secretions.   If the cough is worse at night, try sleeping in a semi-upright position in a recliner or using a couple of pillows.   Avoid cigarette smoke and lung irritants. If you smoke, quit.  Stay inside when pollution and ozone levels are high.   Stay current with vaccinations and immunizations.   Follow up with your health care provider as directed.  SEEK MEDICAL CARE IF:  You cough up more thick, discolored mucus (sputum) that is yellow to green in color.  You have a fever or persistent symptoms for more than 2-3 days.  You cannot control your cough and are losing sleep. SEEK IMMEDIATE MEDICAL CARE IF:   You cough up blood.   You have chest pain or increasing shortness of breath.   You have pain that is getting worse or is uncontrolled with medicines.   You have a fever and your symptoms suddenly get worse. MAKE SURE YOU:  Understand these instructions.   Will watch your condition.   Will get help right away if you are not doing well or get worse.    This information is not intended to replace advice given to  you by your health care provider. Make sure you discuss any questions you have with your health care provider.   Document Released: 07/09/2007 Document Revised: 09/16/2013 Document Reviewed: 03/19/2013 Elsevier Interactive Patient Education [employer].

## 2016-05-31 ENCOUNTER — Ambulatory Visit (INDEPENDENT_AMBULATORY_CARE_PROVIDER_SITE_OTHER): Payer: Medicare Other | Admitting: Internal Medicine

## 2016-05-31 ENCOUNTER — Encounter: Payer: Self-pay | Admitting: Internal Medicine

## 2016-05-31 VITALS — BP 132/70 | HR 74 | Ht 61.0 in | Wt 126.6 lb

## 2016-05-31 DIAGNOSIS — J479 Bronchiectasis, uncomplicated: Secondary | ICD-10-CM | POA: Diagnosis not present

## 2016-05-31 NOTE — Patient Instructions (Signed)
Follow up in 6 months with CT chest

## 2016-05-31 NOTE — Progress Notes (Signed)
[facility] Consultation      Date: 05/31/2016,   MRN# [id number] Christina Coffey 19-Feb-1927 Code Status:  Code Status History    Date Active Date Inactive Code Status Order ID Comments User Context   07/23/2014  3:29 PM 07/25/2014  3:32 PM Full Code [id number]  Newman Pies, MD Inpatient     Hosp day:'@LENGTHOFSTAYDAYS'$ @ Referring MD: '@ATDPROV'$ @     PCP:      AdmissionWeight: 126 lb 9.6 oz (57.4 kg)                 CurrentWeight: 126 lb 9.6 oz (57.4 kg) Christina Coffey is a 82 y.o. old female seen in consultation for chronic cough at the request of Dr. Doy Hutching     CHIEF COMPLAINT:   Mild cough, follow up abnormal CT chest    HISTORY OF PRESENT ILLNESS  82 yo very active white female seen today for mild chronic cough for approx 1.5 years. It is associated with prodcutive cough with white phlegm Patient DOES NOT want any inhalers at this time    Over the past 1.5 years. Patient states that she is very active, works in the yard, she has no SOB no WOB. No chest pain. She denies having fevers, chills, NVD.  No lower ext sweliing  She has had a persistent Left Lower lobe opacity dating back to 2011  Her previous CT scans did mention increasing opacification however,  patient does NOT have any resp symptoms at this time  Supposedly, she underwent Bronch with biopsy in Oct 2015 no report found, suggests benign process  I have reviewed CT scans since 2011, the most recenty Ct chest 01/2016 reveals increased LLL opacity    Current Medication:  Current Outpatient Prescriptions:  .  aspirin 81 MG tablet, Take 81 mg by mouth daily., Disp: , Rfl:  .  hydrochlorothiazide (HYDRODIURIL) 25 MG tablet, Take 25 mg by mouth daily., Disp: , Rfl:  .  losartan (COZAAR) 100 MG tablet, Take 100 mg by mouth daily., Disp: , Rfl:  .  pantoprazole (PROTONIX) 40 MG tablet, Take 40 mg by mouth daily., Disp: , Rfl:     ALLERGIES   Ace inhibitors; Ciprofloxacin; Macrodantin  [nitrofurantoin macrocrystal]; Novocain [procaine]; and Sulfa antibiotics     REVIEW OF SYSTEMS   Review of Systems  Constitutional: Negative for chills, fever, malaise/fatigue and weight loss.  HENT: Positive for congestion. Negative for hearing loss.   Eyes: Negative for blurred vision and double vision.  Respiratory: Positive for cough and sputum production. Negative for hemoptysis, shortness of breath and wheezing.   Cardiovascular: Negative for chest pain, palpitations, orthopnea and leg swelling.  Gastrointestinal: Negative for abdominal pain, heartburn, nausea and vomiting.  Skin: Negative for rash.  Neurological: Negative for dizziness and headaches.  All other systems reviewed and are negative.    VS: BP 132/70 (BP Location: Left Arm, Cuff Size: Normal)   Pulse 74   Ht '5\' 1"'$  (1.549 m)   Wt 126 lb 9.6 oz (57.4 kg)   SpO2 97%   BMI 23.92 kg/m      PHYSICAL EXAM  Physical Exam  Constitutional: She is oriented to person, place, and time. No distress.  Eyes: No scleral icterus.  Neck: Neck supple.  Cardiovascular: Normal rate, regular rhythm and normal heart sounds.   No murmur heard. Pulmonary/Chest: No stridor. No respiratory distress. She has wheezes. She has rales.  Musculoskeletal: Normal range of motion. She exhibits no edema.  Neurological: She  is alert and oriented to person, place, and time. No cranial nerve deficit.  Skin: Skin is warm. She is not diaphoretic.  Psychiatric: She has a normal mood and affect.          IMAGING   CT chest reveiwed since 2011 images reveiwed 05/31/2016 06/20/10 09/20/10 10/16/13 11/25/13 12/15/14 06/30/15 02/01/2016  ALL  films show slow progressive enlarging  LLL opacity since 2011 Most recent with air bronchogram signs, now B/L opacificiations Etiology unkown    ASSESSMENT/PLAN   82 yo white female with extensive envireonemental exposure to [place] and spray paints/porcelin with bronchiectasis based on CT chest  findings as well as a persistent LLL opacity Likely chronic inflammation since 2011 without signs of infection at this time  AT this time, patient does NOT want any further work up at this time, she does NOT want Bronch or biopsy at this time. She is very active and works in yard daily, there are no respiratory of acute of cronic respiratory that is affecting her lifestyle. There is no sign of infection at this time  I have advised patient to call if symptoms change, whi will need bronch with Bx or evene Lung biopsy for definitive dx, but will NOT pursue at this time. She would like a repeat CT  chest in 6 months which I think is reasonable Patient also refuses to take any inhalers at this time and does NOT want to use flutter device at this time  Follow up in 6 months with CT chest     The Patient requires high complexity decision making for assessment and support, frequent evaluation and titration of therapies, application of advanced monitoring technologies and extensive interpretation of multiple databases.   Patient  satisfied with Plan of action and management. All questions answered  Corrin Parker, M.D.  [facility]  Medical Director [place] Director [facility] Cardio-Pulmonary Department

## 2016-09-28 ENCOUNTER — Ambulatory Visit: Payer: Medicare Other | Admitting: Internal Medicine

## 2016-10-02 ENCOUNTER — Other Ambulatory Visit: Payer: Self-pay | Admitting: Internal Medicine

## 2016-10-02 DIAGNOSIS — F4489 Other dissociative and conversion disorders: Secondary | ICD-10-CM

## 2016-10-03 ENCOUNTER — Encounter: Payer: Self-pay | Admitting: Internal Medicine

## 2016-10-03 ENCOUNTER — Ambulatory Visit (INDEPENDENT_AMBULATORY_CARE_PROVIDER_SITE_OTHER): Payer: Medicare Other | Admitting: Internal Medicine

## 2016-10-03 VITALS — BP 138/86 | HR 78 | Wt 139.0 lb

## 2016-10-03 DIAGNOSIS — R938 Abnormal findings on diagnostic imaging of other specified body structures: Secondary | ICD-10-CM

## 2016-10-03 DIAGNOSIS — R9389 Abnormal findings on diagnostic imaging of other specified body structures: Secondary | ICD-10-CM

## 2016-10-03 NOTE — Patient Instructions (Signed)
CT chest to be ordered

## 2016-10-03 NOTE — Progress Notes (Signed)
[facility] Consultation      Date: 10/03/2016,   MRN# [id number] Christina Coffey 1926/11/28 Code Status:  Code Status History    Date Active Date Inactive Code Status Order ID Comments User Context   07/23/2014  3:29 PM 07/25/2014  3:32 PM Full Code [id number]  Newman Pies, MD Inpatient     Hosp day:'@LENGTHOFSTAYDAYS'$ @ Referring MD: '@ATDPROV'$ @     PCP:      AdmissionWeight: 139 lb (63 kg)                 CurrentWeight: 139 lb (63 kg) Christina Coffey is a 82 y.o. old female seen in consultation for chronic cough at the request of Dr. Doy Hutching     CHIEF COMPLAINT:   Mild cough, follow up abnormal CT chest    HISTORY OF PRESENT ILLNESS  83 yo very active white female seen today for mild chronic cough for approx 1.5 years. It is associated with prodcutive cough with white phlegm Patient DOES NOT want any inhalers at this time    Over the past 1.5 years. Patient states that she is very active, works in the yard, she has no SOB no WOB. No chest pain. She denies having fevers, chills, NVD.  No lower ext sweliing  She has had a persistent Left Lower lobe opacity dating back to 2011  Her previous CT scans did mention increasing opacification however,  patient does NOT have any resp symptoms at this time  Supposedly, she underwent Bronch with biopsy in Oct 2015 no report found, suggests benign process  I have reviewed CT scans since 2011, the most recenty Ct chest 01/2016 reveals increased LLL opacity  Patient here for follow up and would like to obtain repeat CT chest  Current Medication:  Current Outpatient Prescriptions:  .  aspirin 81 MG tablet, Take 81 mg by mouth daily., Disp: , Rfl:  .  hydrochlorothiazide (HYDRODIURIL) 25 MG tablet, Take 25 mg by mouth daily., Disp: , Rfl:  .  losartan (COZAAR) 100 MG tablet, Take 100 mg by mouth daily., Disp: , Rfl:  .  pantoprazole (PROTONIX) 40 MG tablet, Take 40 mg by mouth daily., Disp: , Rfl:     ALLERGIES    Ace inhibitors; Ciprofloxacin; Macrodantin [nitrofurantoin macrocrystal]; Novocain [procaine]; and Sulfa antibiotics     REVIEW OF SYSTEMS   Review of Systems  Constitutional: Negative for chills, fever, malaise/fatigue and weight loss.  HENT: Negative for congestion and hearing loss.   Eyes: Negative for blurred vision and double vision.  Respiratory: Positive for cough and sputum production. Negative for hemoptysis, shortness of breath and wheezing.   Cardiovascular: Negative for chest pain, palpitations, orthopnea and leg swelling.  Gastrointestinal: Negative for abdominal pain, heartburn, nausea and vomiting.  Skin: Negative for rash.  Neurological: Negative for dizziness and headaches.  All other systems reviewed and are negative.    VS: BP 138/86 (BP Location: Left Arm, Cuff Size: Normal)   Pulse 78   Wt 139 lb (63 kg)   SpO2 97%   BMI 26.26 kg/m      PHYSICAL EXAM  Physical Exam  Constitutional: She is oriented to person, place, and time. No distress.  Eyes: No scleral icterus.  Neck: Neck supple.  Cardiovascular: Normal rate, regular rhythm and normal heart sounds.   No murmur heard. Pulmonary/Chest: No stridor. No respiratory distress. She has no wheezes. She has rales.  Left lower base rales  Musculoskeletal: Normal range of motion. She exhibits no  edema.  Neurological: She is alert and oriented to person, place, and time. No cranial nerve deficit.  Skin: Skin is warm. She is not diaphoretic.  Psychiatric: She has a normal mood and affect.          IMAGING   CT chest reveiwed since 2011 images reveiwed 06/20/10 09/20/10 10/16/13 11/25/13 12/15/14 06/30/15 02/01/2016  ALL  films show slow progressive enlarging  LLL opacity since 2011 Most recent with air bronchogram signs, now B/L opacificiations Etiology unkown    ASSESSMENT/PLAN   83 yo white female with extensive envireonemental exposure to [place] and spray paints/porcelin with bronchiectasis  based on CT chest findings as well as a persistent LLL opacity Likely chronic inflammation since 2011 without signs of infection at this time  AT this time, patient is open to further work up if there is significant changes on a current CT chest. She is very active and works in yard daily, there are no respiratory of acute of cronic respiratory that is affecting her lifestyle. There is no sign of infection at this time   Patient also refuses to take any inhalers at this time and does NOT want to use flutter device at this time  Follow up after CT chest completed     The Patient requires high complexity decision making for assessment and support, frequent evaluation and titration of therapies, application of advanced monitoring technologies and extensive interpretation of multiple databases.   Patient  satisfied with Plan of action and management. All questions answered  Corrin Parker, M.D.  [facility]  Medical Director [place] Director [facility] Cardio-Pulmonary Department

## 2016-10-17 ENCOUNTER — Ambulatory Visit
Admission: RE | Admit: 2016-10-17 | Discharge: 2016-10-17 | Disposition: A | Discharge status: Home / Self Care | Payer: Medicare Other | Source: Ambulatory Visit | Attending: Internal Medicine | Admitting: Internal Medicine

## 2016-10-17 DIAGNOSIS — G319 Degenerative disease of nervous system, unspecified: Secondary | ICD-10-CM | POA: Insufficient documentation

## 2016-10-17 DIAGNOSIS — R918 Other nonspecific abnormal finding of lung field: Secondary | ICD-10-CM | POA: Insufficient documentation

## 2016-10-17 DIAGNOSIS — R938 Abnormal findings on diagnostic imaging of other specified body structures: Secondary | ICD-10-CM | POA: Insufficient documentation

## 2016-10-17 DIAGNOSIS — I6782 Cerebral ischemia: Secondary | ICD-10-CM | POA: Insufficient documentation

## 2016-10-17 DIAGNOSIS — F4489 Other dissociative and conversion disorders: Secondary | ICD-10-CM | POA: Diagnosis not present

## 2016-10-17 DIAGNOSIS — R9389 Abnormal findings on diagnostic imaging of other specified body structures: Secondary | ICD-10-CM

## 2016-10-30 ENCOUNTER — Telehealth: Payer: Self-pay | Admitting: Internal Medicine

## 2016-10-30 ENCOUNTER — Ambulatory Visit: Payer: Medicare Other | Admitting: Internal Medicine

## 2016-10-30 NOTE — Telephone Encounter (Signed)
Patient was late for her appt and had to r/s but is confused on her CT chest. Does she need to have one done? Please call patient.

## 2016-10-30 NOTE — Telephone Encounter (Signed)
Pt had CT on 10-17-16. Pt aware that she will not have to have another CT before her f/u with [facility] on 12-11-16. Pt voiced her understanding and had no further questions. Nothing further needed.

## 2016-12-06 ENCOUNTER — Ambulatory Visit: Payer: Medicare Other

## 2016-12-11 ENCOUNTER — Ambulatory Visit (INDEPENDENT_AMBULATORY_CARE_PROVIDER_SITE_OTHER): Payer: Medicare Other | Admitting: Internal Medicine

## 2016-12-11 ENCOUNTER — Encounter: Payer: Self-pay | Admitting: Internal Medicine

## 2016-12-11 VITALS — BP 130/80 | HR 78 | Ht 61.0 in | Wt 127.2 lb

## 2016-12-11 DIAGNOSIS — R938 Abnormal findings on diagnostic imaging of other specified body structures: Secondary | ICD-10-CM

## 2016-12-11 DIAGNOSIS — R9389 Abnormal findings on diagnostic imaging of other specified body structures: Secondary | ICD-10-CM

## 2016-12-11 NOTE — Patient Instructions (Signed)
Follow up in 6 months 

## 2016-12-11 NOTE — Progress Notes (Signed)
[facility] Consultation      Date: 12/11/2016,   MRN# [id number] Christina Coffey [birth year] Code Status:  Code Status History    Date Active Date Inactive Code Status Order ID Comments User Context   07/23/2014  3:29 PM 07/25/2014  3:32 PM Full Code [id number]  Newman Pies, MD Inpatient     [facility] day:'@LENGTHOFSTAYDAYS'$ @ Referring MD: '@ATDPROV'$ @     PCP:      AdmissionWeight: 127 lb 3.2 oz (57.7 kg)                 CurrentWeight: 127 lb 3.2 oz (57.7 kg) Christina Coffey is a 83 y.o. old female seen in consultation for chronic cough at the request of Dr. Doy Hutching     CHIEF COMPLAINT:   Mild cough, follow up abnormal CT chest    HISTORY OF PRESENT ILLNESS  83 yo very active white female seen today for mild chronic cough for approx 1.5 years. It is associated with prodcutive cough with white phlegm Patient DOES NOT want any inhalers at this time    Over the past 1.5 years. Patient states that she is very active, works in the yard, she has no SOB no WOB. No chest pain. She denies having fevers, chills, NVD.  No lower ext sweliing  She has had a persistent Left Lower lobe opacity dating back to 2011  Her previous CT scans did mention increasing opacification however,  patient does NOT have any resp symptoms at this time  Supposedly, she underwent Bronch with biopsy in Oct 2015 no report found, suggests benign process  I have reviewed CT scans since 2011, the most recenty Ct chest 01/2016 reveals increased LLL opacity  After further discussion with patient, she would NOT want any procedures of chemo or RXT if this opacity were to be malignant   Current Medication:  Current Outpatient Prescriptions:  .  aspirin 81 MG tablet, Take 81 mg by mouth daily., Disp: , Rfl:  .  hydrochlorothiazide (HYDRODIURIL) 25 MG tablet, Take 25 mg by mouth daily., Disp: , Rfl:  .  losartan (COZAAR) 100 MG tablet, Take 100 mg by mouth daily., Disp: , Rfl:  .  pantoprazole (PROTONIX)  40 MG tablet, Take 40 mg by mouth daily., Disp: , Rfl:     ALLERGIES   Ace inhibitors; Ciprofloxacin; Macrodantin [nitrofurantoin macrocrystal]; Novocain [procaine]; and Sulfa antibiotics     REVIEW OF SYSTEMS   Review of Systems  Constitutional: Negative for chills, fever, malaise/fatigue and weight loss.  HENT: Negative for congestion and hearing loss.   Eyes: Negative for blurred vision and double vision.  Respiratory: Positive for cough and sputum production. Negative for hemoptysis, shortness of breath and wheezing.   Cardiovascular: Negative for chest pain, palpitations, orthopnea and leg swelling.  Gastrointestinal: Negative for abdominal pain, heartburn, nausea and vomiting.  Skin: Negative for rash.  Neurological: Negative for dizziness and headaches.  All other systems reviewed and are negative.    VS: BP 130/80 (BP Location: Left Arm, Cuff Size: Normal)   Pulse 78   Ht '5\' 1"'$  (1.549 m)   Wt 127 lb 3.2 oz (57.7 kg)   SpO2 98%   BMI 24.03 kg/m      PHYSICAL EXAM  Physical Exam  Constitutional: She is oriented to person, place, and time. No distress.  Eyes: No scleral icterus.  Neck: Neck supple.  Cardiovascular: Normal rate, regular rhythm and normal heart sounds.   No murmur heard. Pulmonary/Chest: No stridor. No  respiratory distress. She has no wheezes. She has rales.  Left lower base rales  Musculoskeletal: Normal range of motion. She exhibits no edema.  Neurological: She is alert and oriented to person, place, and time. No cranial nerve deficit.  Skin: Skin is warm. She is not diaphoretic.  Psychiatric: She has a normal mood and affect.          IMAGING   CT chest reveiwed since 2011 images reveiwed 06/20/10 09/20/10 10/16/13 11/25/13 12/15/14 06/30/15 02/01/2016  ALL  films show slow progressive enlarging  LLL opacity since 2011 Most recent with air bronchogram signs, now B/L opacificiations Etiology unkown    ASSESSMENT/PLAN   83 yo  white female with extensive envireonemental exposure to [place] and spray paints/porcelin with bronchiectasis based on CT chest findings as well as a persistent LLL opacity Likely chronic inflammation since 2011 without signs of infection at this time  AT this time, will NOT undergo further CT scans at this time, as patient does NOT want work up or therapy She is very active and works in yard daily, there are no respiratory of acute of cronic respiratory that is affecting her lifestyle. There is no sign of infection at this time   Patient also refuses to take any inhalers at this time and does NOT want to use flutter device at this time  Follow up as needed   Patient  satisfied with Plan of action and management. All questions answered  Corrin Parker, M.D.  [facility]  Medical Director [place] Director [facility] Cardio-Pulmonary Department

## 2016-12-11 NOTE — Progress Notes (Signed)
4wk rop

## 2017-10-08 ENCOUNTER — Other Ambulatory Visit: Payer: Self-pay

## 2017-10-08 ENCOUNTER — Ambulatory Visit: Payer: Medicare Other | Admitting: Internal Medicine

## 2017-10-08 ENCOUNTER — Encounter: Payer: Self-pay | Admitting: Internal Medicine

## 2017-10-08 ENCOUNTER — Encounter: Payer: Self-pay | Admitting: Emergency Medicine

## 2017-10-08 ENCOUNTER — Ambulatory Visit (INDEPENDENT_AMBULATORY_CARE_PROVIDER_SITE_OTHER): Payer: Medicare Other | Admitting: Internal Medicine

## 2017-10-08 ENCOUNTER — Emergency Department
Admission: EM | Admit: 2017-10-08 | Discharge: 2017-10-08 | Disposition: A | Discharge status: Home / Self Care | Payer: Medicare Other | Attending: Emergency Medicine | Admitting: Emergency Medicine

## 2017-10-08 VITALS — BP 150/110 | HR 97 | Ht 61.0 in | Wt 127.0 lb

## 2017-10-08 DIAGNOSIS — Z79899 Other long term (current) drug therapy: Secondary | ICD-10-CM | POA: Insufficient documentation

## 2017-10-08 DIAGNOSIS — Z7982 Long term (current) use of aspirin: Secondary | ICD-10-CM | POA: Diagnosis not present

## 2017-10-08 DIAGNOSIS — Z87891 Personal history of nicotine dependence: Secondary | ICD-10-CM | POA: Insufficient documentation

## 2017-10-08 DIAGNOSIS — R9389 Abnormal findings on diagnostic imaging of other specified body structures: Secondary | ICD-10-CM | POA: Diagnosis not present

## 2017-10-08 DIAGNOSIS — I10 Essential (primary) hypertension: Secondary | ICD-10-CM

## 2017-10-08 NOTE — ED Triage Notes (Addendum)
Arrives with c/o high blood pressure.  Patient was at [facility] today and was sent to ED for evaluation.  Patient is aggravated that she was sent to ED because "I can take care of myself".  Patient is AAOx3.  Skin warm and dry.  MAE equally and strong.  PCP at [facility] felt patient presented with new confusion.

## 2017-10-08 NOTE — ED Notes (Signed)
NAD noted at time of D/C. Pt and son denies questions or concerns. Pt taken to the lobby via wheelchair at this time.

## 2017-10-08 NOTE — ED Provider Notes (Signed)
[facility] Emergency Department Provider Note ____________________________________________   I have reviewed the triage vital signs and the triage nursing note.  HISTORY  Chief Complaint Hypertension   Historian Patient and son  HPI Christina Coffey is a 84 y.o. female from home, lives alone, has some mild memory problems and today she headed to the hospital because she had an appointment, but upon getting here could not remember which clinic she was supposed to be at.  Son states she does sometimes have this problem with not having access to [employer] e-record and then not being able to find which appointment she was supposed to be at.  She is independent and did not ask son to go with her to appointment.  She ended up at [facility] and ended up seeing Dr. Mortimer Fries -- unclear if she had an appointment, but she was seen in follow up and note is available in Epic -- he was apparently concerned that she was confused and did not have family to check baseline and her BP was somewhat elevated, although hypertension.  She was sent to the ER for further evaluation.  Here in the ER her son met her and states that she really is at her mental status baseline.  There is been no additional confusion although she does have some mild confusion that waxes and wanes at times, and the issue of having left the house and gotten confused which appointment she was supposed to be at is fairly common.     Past Medical History:  Diagnosis Date  . Arthritis   . GERD (gastroesophageal reflux disease)   . Hypertension     Patient Active Problem List   Diagnosis Date Noted  . Bronchiectasis ([place]) 02/29/2016  . Spondylolisthesis at L4-L5 level 07/23/2014    Past Surgical History:  Procedure Laterality Date  . ABDOMINAL HYSTERECTOMY    . ANKLE FRACTURE SURGERY     with titanium plates & screws  right  . BACK SURGERY    . FOOT SURGERY     she thinks both....mainly bunions    Prior to  Admission medications   Medication Sig Start Date End Date Taking? Authorizing Provider  aspirin 81 MG tablet Take 81 mg by mouth daily.   Yes [provider]  hydrochlorothiazide (HYDRODIURIL) 25 MG tablet Take 25 mg by mouth daily.   Yes [provider]  losartan (COZAAR) 100 MG tablet Take 100 mg by mouth daily.   Yes [provider]  pantoprazole (PROTONIX) 40 MG tablet Take 40 mg by mouth daily.   Yes [provider]    Allergies  Allergen Reactions  . Ace Inhibitors Cough  . Ciprofloxacin Palpitations  . Macrodantin [Nitrofurantoin Macrocrystal] Palpitations  . Novocain [Procaine] Palpitations  . Sulfa Antibiotics Palpitations    Family History  Problem Relation Age of Onset  . Breast cancer Neg Hx     Social History Social History   Tobacco Use  . Smoking status: Former Smoker    Packs/day: 1.00    Years: 38.00    Pack years: 38.00  . Smokeless tobacco: Former Systems developer    Quit date: 07/16/1982  Substance Use Topics  . Alcohol use: Yes    Alcohol/week: 21.0 oz    Types: 35 Glasses of wine per week    Comment: wine  . Drug use: No    Review of Systems  Constitutional: Negative for fever. Eyes: Negative for visual changes. ENT: Negative for sore throat. Cardiovascular: Negative for chest  pain. Respiratory: Negative for shortness of breath. Gastrointestinal: Negative for abdominal pain, vomiting and diarrhea. Genitourinary: Negative for dysuria. Musculoskeletal: Negative for back pain. Skin: Negative for rash. Neurological: Negative for headache.  ____________________________________________   PHYSICAL EXAM:  VITAL SIGNS: ED Triage Vitals  Enc Vitals Group     BP 10/08/17 1140 (!) 213/100     Pulse Rate 10/08/17 1140 (!) 107     Resp 10/08/17 1140 16     Temp 10/08/17 1140 98.2 F (36.8 C)     Temp Source 10/08/17 1140 Oral     SpO2 10/08/17 1140 95 %     Weight 10/08/17 1141 127 lb (57.6 kg)     Height 10/08/17  1141 _0  (1.549 m)     Head Circumference --      Peak Flow --      Pain Score 10/08/17 1140 0     Pain Loc --      Pain Edu? --      Excl. in [place]? --      Constitutional: Alert and oriented, but somewhat poor historian. Well appearing and in no distress. HEENT   Head: Normocephalic and atraumatic.      Eyes: Conjunctivae are normal. Pupils equal and round.       Ears:         Nose: No congestion/rhinnorhea.   Mouth/Throat: Mucous membranes are moist.   Neck: No stridor. Cardiovascular/Chest: Normal rate, regular rhythm.  No murmurs, rubs, or gallops. Respiratory: Normal respiratory effort without tachypnea nor retractions. Breath sounds are clear and equal bilaterally. No wheezes/rales/rhonchi. Gastrointestinal: Soft. No distention, no guarding, no rebound. Nontender.    Genitourinary/rectal:Deferred Musculoskeletal: Nontender with normal range of motion in all extremities. No joint effusions.  No lower extremity tenderness.  No edema. Neurologic:  Normal speech and language. No gross or focal neurologic deficits are appreciated. Skin:  Skin is warm, dry and intact. No rash noted. Psychiatric: Mood and affect are normal. Speech and behavior are normal. Patient exhibits appropriate insight and judgment.   ____________________________________________  LABS (pertinent positives/negatives) I, Lisa Roca, MD the attending physician have reviewed the labs noted below.  Labs Reviewed - No data to display  ____________________________________________    EKG I, Lisa Roca, MD, the attending physician have personally viewed and interpreted all ECGs.  EKG #1.  101 bpm.  Sinus tach right bundle branch block. Nonspecific ST and T wave  EKG number 296 bpm.  Normal sinus rhythm.  Right bundle branch block.  Normal axis.  Nonspecific ST and T wave. ____________________________________________  RADIOLOGY All Xrays were viewed by me.  Imaging interpreted by Radiologist,  and I, Lisa Roca, MD the attending physician have reviewed the radiologist interpretation noted below.  None __________________________________________  PROCEDURES  Procedure(s) performed: None  Critical Care performed: None   ____________________________________________  ED COURSE / ASSESSMENT AND PLAN  Pertinent labs & imaging results that were available during my care of the patient were reviewed by me and considered in my medical decision making (see chart for details).   I did review Dr. Zoila Shutter note, it sounds like they were confused in her fairly poor historian situation and no family members to confirm her baseline with an elevated blood pressure and sent her for to the ED for further evaluation.  Here the son is with her and states that she is really at her baseline.  Patient does not appear to have any acute neurologic findings and given that they are both comfortable with  no additional workup I think this is reasonable.  She does have history of hypertension her blood pressure is little high here.  We discussed often folks run high in the emergency department, and without symptoms, please follow this at home and discuss with primary care physician.  I also discussed with the patient and the son together that the patient had been declining or refusing recommended routine follow-up CT scan for the pulmonary findings, and son states he was aware of that and they would talk about it.   CONSULTATIONS:   None   Patient / Family / Caregiver informed of clinical course, medical decision-making process, and agree with plan.   I discussed return precautions, follow-up instructions, and discharge instructions with patient and/or family.  Discharge Instructions : You are evaluated for elevated blood pressure and concern for confusion, and although no certain cause was found, after discussion with you and your son, it sounds like things are actually at baseline.  Please  follow-up with your primary care doctor.  Return to the emergency department immediately for any worsening condition including any confusion, altered mental status, chest pain, trouble breathing, shortness of breath, or any other symptoms concerning to you.    ___________________________________________   FINAL CLINICAL IMPRESSION(S) / ED DIAGNOSES   Final diagnoses:  Hypertension, unspecified type      ___________________________________________        Note: This dictation was prepared with Dragon dictation. Any transcriptional errors that result from this process are unintentional    Lisa Roca, MD 10/08/17 1331

## 2017-10-08 NOTE — ED Notes (Signed)
MD at bedside to discuss patient BP with patient and son. Per MD patient is okay for D/C.

## 2017-10-08 NOTE — Progress Notes (Signed)
[place] [facility] Consultation      Date: 10/08/2017,   MRN# [id number] JESSEKA DRINKARD July 10, 1927     Admission                  Current  Mattia P Metzner is a 84 y.o. old female seen in consultation for chronic cough at the request of Dr. Doy Hutching     CHIEF COMPLAINT:   Mild cough, follow up abnormal CT chest    HISTORY OF PRESENT ILLNESS   PREVIOUS OV  84 yo very active white female seen today for mild chronic cough for approx 1.5 years. It is associated with prodcutive cough with white phlegm Patient DOES NOT want any inhalers at this time    Over the past 1.5 years. Patient states that she is very active, works in the yard, she has no SOB no WOB. No chest pain. She denies having fevers, chills, NVD.  No lower ext sweliing  She has had a persistent Left Lower lobe opacity dating back to 2011  Her previous CT scans did mention increasing opacification however,  patient does NOT have any resp symptoms at this time  Supposedly, she underwent Bronch with biopsy in Oct 2015 no report found, suggests benign process  I have reviewed CT scans since 2011, the most recenty Ct chest 01/2016 reveals increased LLL opacity  After further discussion with patient, she would NOT want any procedures of chemo or RXT if this opacity were to be malignant    Current visit I have explained to patient that she has NOT wanted any work up for abnormal CT findings She seems to be some what confused as to why she is here to be seen  We have tried multiple times to call family members to find out more information She states that she has no SOB, no DOE No fevers, chills She has no acute issues at this time No signs of infection at this time    Current Medication:  Current Outpatient Medications:  .  aspirin 81 MG tablet, Take 81 mg by mouth daily., Disp: , Rfl:  .  hydrochlorothiazide (HYDRODIURIL) 25 MG tablet, Take 25 mg by mouth daily., Disp: , Rfl:  .  losartan (COZAAR) 100 MG  tablet, Take 100 mg by mouth daily., Disp: , Rfl:  .  pantoprazole (PROTONIX) 40 MG tablet, Take 40 mg by mouth daily., Disp: , Rfl:     ALLERGIES   Ace inhibitors; Ciprofloxacin; Macrodantin [nitrofurantoin macrocrystal]; Novocain [procaine]; and Sulfa antibiotics     REVIEW OF SYSTEMS   Review of Systems  Constitutional: Negative for chills, fever, malaise/fatigue and weight loss.  HENT: Negative for congestion and hearing loss.   Eyes: Negative for blurred vision and double vision.  Respiratory: Negative for cough, hemoptysis, sputum production, shortness of breath and wheezing.   Cardiovascular: Negative for chest pain, palpitations, orthopnea and leg swelling.  Gastrointestinal: Negative for abdominal pain, heartburn, nausea and vomiting.  Skin: Negative for rash.  Neurological: Negative for dizziness and headaches.  All other systems reviewed and are negative.  Ht 5\' 1"  (1.549 m)   Wt 127 lb (57.6 kg)   BMI 24.00 kg/m   BP is high 150/110  BP high 150/110  PHYSICAL EXAM  Physical Exam  Constitutional: She is oriented to person, place, and time. No distress.  Eyes: No scleral icterus.  Neck: Neck supple.  Cardiovascular: Normal rate, regular rhythm and normal heart sounds.  No murmur heard. Pulmonary/Chest: No stridor. No  respiratory distress. She has no wheezes. She has rales.  Left lower base rales  Musculoskeletal: Normal range of motion. She exhibits no edema.  Neurological: She is alert and oriented to person, place, and time. No cranial nerve deficit.  Skin: Skin is warm. She is not diaphoretic.  Psychiatric: She has a normal mood and affect.          IMAGING   CT chest reveiwed since 2011 images reveiwed 06/20/10 09/20/10 10/16/13 11/25/13 12/15/14 06/30/15 02/01/2016  ALL  films show slow progressive enlarging  LLL opacity since 2011 Most recent with air bronchogram signs, now B/L opacificiations Etiology unkown    ASSESSMENT/PLAN   84 yo  white female with extensive envireonemental exposure to [place] and spray paints/porcelin with bronchiectasis based on CT chest findings as well as a persistent LLL opacity Likely chronic inflammation since 2011 without signs of infection at this time  AT this time, will NOT undergo further CT scans at this time, as patient does NOT want work up or therapy She is very active and works in yard daily, there are no respiratory of acute of cronic respiratory that is affecting her lifestyle. There is no sign of infection at this time   Patient also refuses to take any inhalers at this time and does NOT want to use flutter device at this time Patient seems to be somewhat confused and her BP is high at this time  Patient needs to be seen in ER for further assessment  Follow up as needed   Patient  satisfied with Plan of action and management. All questions answered  Corrin Parker, M.D.  [facility]  Medical Director [place] Director [facility] Cardio-Pulmonary Department

## 2017-10-08 NOTE — Patient Instructions (Signed)
Patient needs to be seen in ER

## 2017-10-08 NOTE — Discharge Instructions (Signed)
You are evaluated for elevated blood pressure and concern for confusion, and although no certain cause was found, after discussion with you and your son, it sounds like things are actually at baseline.  Please follow-up with your primary care doctor.  Return to the emergency department immediately for any worsening condition including any confusion, altered mental status, chest pain, trouble breathing, shortness of breath, or any other symptoms concerning to you.

## 2017-12-06 ENCOUNTER — Other Ambulatory Visit: Payer: Self-pay | Admitting: Internal Medicine

## 2017-12-06 DIAGNOSIS — R9389 Abnormal findings on diagnostic imaging of other specified body structures: Secondary | ICD-10-CM

## 2017-12-19 ENCOUNTER — Ambulatory Visit
Admission: RE | Admit: 2017-12-19 | Discharge: 2017-12-19 | Disposition: A | Discharge status: Home / Self Care | Payer: Medicare Other | Source: Ambulatory Visit | Attending: Internal Medicine | Admitting: Internal Medicine

## 2017-12-19 DIAGNOSIS — I2510 Atherosclerotic heart disease of native coronary artery without angina pectoris: Secondary | ICD-10-CM | POA: Diagnosis not present

## 2017-12-19 DIAGNOSIS — I700 Atherosclerosis of aorta: Secondary | ICD-10-CM | POA: Diagnosis not present

## 2017-12-19 DIAGNOSIS — R9389 Abnormal findings on diagnostic imaging of other specified body structures: Secondary | ICD-10-CM

## 2017-12-19 DIAGNOSIS — J479 Bronchiectasis, uncomplicated: Secondary | ICD-10-CM | POA: Diagnosis not present

## 2017-12-19 DIAGNOSIS — K449 Diaphragmatic hernia without obstruction or gangrene: Secondary | ICD-10-CM | POA: Insufficient documentation

## 2017-12-31 ENCOUNTER — Ambulatory Visit: Payer: Medicare Other | Admitting: Internal Medicine

## 2017-12-31 ENCOUNTER — Encounter: Payer: Self-pay | Admitting: Internal Medicine

## 2017-12-31 VITALS — BP 110/80 | HR 88 | Ht 61.0 in | Wt 119.0 lb

## 2017-12-31 DIAGNOSIS — J181 Lobar pneumonia, unspecified organism: Secondary | ICD-10-CM

## 2017-12-31 NOTE — Progress Notes (Signed)
[place] [facility] Consultation      Date: 12/31/2017,   MRN# [id number] Christina Coffey 12/06/1926     AdmissionWeight: 119 lb (54 kg)                 CurrentWeight: 119 lb (54 kg) Christina Coffey is a 84 y.o. old female seen in consultation for chronic cough at the request of Dr. Doy Hutching     CHIEF COMPLAINT:   follow up abnormal CT chest    HISTORY OF PRESENT ILLNESS   PREVIOUS OV  84 yo very active white female seen today for mild chronic cough for approx 1.5 years. It is associated with prodcutive cough with white phlegm Patient DOES NOT want any inhalers at this time    Over the past 1.5 years. Patient states that she is very active, works in the yard, she has no SOB no WOB. No chest pain. She denies having fevers, chills, NVD.  No lower ext sweliing  She has had a persistent Left Lower lobe opacity dating back to 2011 Her previous CT scans did mention increasing opacification however,  patient does NOT have any resp symptoms at this time Supposedly, she underwent Bronch with biopsy in Oct 2015 no report found, suggests benign process I have reviewed CT scans since 2011, the most recenty Ct chest 01/2016 reveals increased LLL opacity After further discussion with patient, she would NOT want any procedures of chemo or RXT if this opacity were to be malignant    Current visit Patient diagnosed with FLU last month and CT chest 11/2017 reveal mulitofocal opacities Left and RT lungs with cavitary process  Findings c/w Viral pneumonia complicated by bacterial pneumonia She had symptoms of productive cough and SOB Patient has been on 3 rounds of ABX and most recently on Augmentin  Patient has no fevers, chills She is NOT  Toxic appearing She has no SOB but has mild cough  She states that she has no SOB, no DOE No fevers, chills She has no acute issues at this time No signs of infection at this time   I talked with her Son Christina Coffey over the phone and plan is to  repeat CT chest in 3 months   Current Medication:  Current Outpatient Medications:  .  aspirin 81 MG tablet, Take 81 mg by mouth daily., Disp: , Rfl:  .  losartan (COZAAR) 100 MG tablet, Take 100 mg by mouth daily., Disp: , Rfl:  .  pantoprazole (PROTONIX) 40 MG tablet, Take 40 mg by mouth daily., Disp: , Rfl:     ALLERGIES   Ace inhibitors; Ciprofloxacin; Macrodantin [nitrofurantoin macrocrystal]; Novocain [procaine]; and Sulfa antibiotics     REVIEW OF SYSTEMS   Review of Systems  Constitutional: Negative for chills, fever, malaise/fatigue and weight loss.  HENT: Negative for congestion and hearing loss.   Eyes: Negative for blurred vision and double vision.  Respiratory: Positive for cough. Negative for hemoptysis, sputum production, shortness of breath and wheezing.   Cardiovascular: Negative for chest pain, palpitations, orthopnea and leg swelling.  Gastrointestinal: Negative for abdominal pain, heartburn, nausea and vomiting.  Skin: Negative for rash.  Neurological: Negative for dizziness and headaches.  All other systems reviewed and are negative.  BP 110/80 (BP Location: Left Arm, Cuff Size: Normal)   Pulse 88   Ht 5\' 1"  (1.549 m)   Wt 119 lb (54 kg)   SpO2 97%   BMI 22.48 kg/m   BP is high 150/110  BP  high 150/110  PHYSICAL EXAM  Physical Exam  Constitutional: She is oriented to person, place, and time. No distress.  Eyes: No scleral icterus.  Neck: Neck supple.  Cardiovascular: Normal rate, regular rhythm and normal heart sounds.  No murmur heard. Pulmonary/Chest: No stridor. No respiratory distress. She has no wheezes. She has rales.  Left lower base rales  Musculoskeletal: Normal range of motion. She exhibits no edema.  Neurological: She is alert and oriented to person, place, and time. No cranial nerve deficit.  Skin: Skin is warm. She is not diaphoretic.  Psychiatric: She has a normal mood and affect.          IMAGING   CT chest reveiwed  since 2011 images reveiwed 06/20/10 09/20/10 10/16/13 11/25/13 12/15/14 06/30/15 02/01/2016  ALL  films show slow progressive enlarging  LLL opacity since 2011 Most recent with air bronchogram signs, now B/L opacificiations Etiology unkown  CT chest 11/2017-images reviewed by ME 4.8.19 Multifocal opacities RT and left upper lobes Multiple large areas of airspace opacification/consolidation in BILATERAL upper lobes and RIGHT lower lobe with areas of cavitation identified within LEFT upper lobe and questionably RIGHT upper lobe.  CT chest  ASSESSMENT/PLAN   84 yo white female with extensive envireonemental exposure to [place] and spray paints/porcelin with bronchiectasis based on CT chest findings as well as a persistent LLL opacity Likely chronic inflammation since 2011.  Patient has new RUL/LUL cavitary opacities most likely s/p viral pneumonia superimposed bacterial pneumonia without signs of sepsis  Patient is to continue ABX as prescribed  I have discussed findings with Son and we will need a repeat CT chest in 3 months I have asked the family to be at next OV for plan of care and management  Follow up in 3 months with repeat CT chest   Patient  satisfied with Plan of action and management. All questions answered  Corrin Parker, M.D.  [facility]  Medical Director [place] Director [facility] Cardio-Pulmonary Department

## 2017-12-31 NOTE — Patient Instructions (Signed)
Take Augmentin as prescibed Repeat CT chest 3 months

## 2018-03-08 ENCOUNTER — Ambulatory Visit: Payer: Medicare Other | Admitting: Internal Medicine

## 2018-03-08 ENCOUNTER — Encounter: Payer: Self-pay | Admitting: Internal Medicine

## 2018-03-08 DIAGNOSIS — C3490 Malignant neoplasm of unspecified part of unspecified bronchus or lung: Secondary | ICD-10-CM

## 2018-03-08 MED ORDER — AMOXICILLIN-POT CLAVULANATE 875-125 MG PO TABS: 1.0000 | ORAL_TABLET | Freq: Two times a day (BID) | ORAL | 0 refills | Status: AC

## 2018-03-08 MED ORDER — PREDNISONE 10 MG PO TABS: 10.0000 mg | ORAL_TABLET | Freq: Every day | ORAL | 0 refills | Status: DC

## 2018-03-08 NOTE — Patient Instructions (Addendum)
Take prednisone and antibiotics for one month.  Blood work in 2 weeks.  Repeat Ct chest in about 5 weeks then follow up with Dr. Mortimer Fries.

## 2018-03-08 NOTE — Progress Notes (Signed)
[facility] Consultation      Date: 03/08/2018,   MRN# [id number] Christina Coffey June 13, 1927     Admission                  Current  Christina Coffey is a 84 y.o. old female seen in consultation for chronic cough at the request of Dr. Doy Hutching     CHIEF COMPLAINT:   follow up abnormal CT chest, continued dyspnea and weight loss.     HISTORY OF PRESENT ILLNESS   The patient is an 84 year old female, who normally sees Dr. Mortimer Coffey.  She has been having a gradual decline in her health with chronic cough for 1/2 to 2 years combined with weight loss.  He was noted to have a persistent left lower lobe opacity going back to 2011, thought to have had a bronchoscopy in October 2015 which was negative.  In her previous visit with Dr. Mortimer Coffey she maintained that she did not want any interventional/invasive diagnostic procedures, nor would she want treatment for cancer.  She has therefore been treated with empiric course of antibiotics.  At her last visit here in April, plan was to repeat a CT chest in approximately 3 months and follow-up at that time.  However the patient saw her primary care physician recently and noted to have continued decline with continued cough with persistent radiographic changes.  Patient had an appointment with Dr. Mortimer Coffey next week, but that appointment was moved up to today as an urgent visit. She had recent imaging at [facility], no reports or images are available for review today.  She does present with her son today, when asked whether she would like further invasive testing she responds "I do not know"  CT chest 12/19/2017>> images personally reviewed in comparison with previous on 10/17/2016, there is new large multifocal opacities in the right upper lobe and superior segment of the right lower lobe, opacity in the right lower lobe remains stable.  The left base fluid-filled abscess/fibrotic area is now larger with a new large pleural-based opacity   >> Previous  OV on 12/31/2017 84 yo very active white female seen today for mild chronic cough for approx 1.5 years. It is associated with prodcutive cough with white phlegm Patient DOES NOT want any inhalers at this time Over the past 1.5 years. Patient states that she is very active, works in the yard, she has no SOB no WOB. No chest pain. She denies having fevers, chills, NVD.  No lower ext sweliing She has had a persistent Left Lower lobe opacity dating back to 2011 Her previous CT scans did mention increasing opacification however,  patient does NOT have any resp symptoms at this time Supposedly, she underwent Bronch with biopsy in Oct 2015 no report found, suggests benign process I have reviewed CT scans since 2011, the most recenty Ct chest 01/2016 reveals increased LLL opacity After further discussion with patient, she would NOT want any procedures of chemo or RXT if this opacity were to be malignant    I talked with her Son Christina Coffey over the phone and plan is to repeat CT chest in 3 months   Current Medication:  Current Outpatient Medications:  .  aspirin 81 MG tablet, Take 81 mg by mouth daily., Disp: , Rfl:  .  losartan (COZAAR) 100 MG tablet, Take 100 mg by mouth daily., Disp: , Rfl:  .  pantoprazole (PROTONIX) 40 MG tablet, Take 40 mg by mouth daily., Disp: ,  Rfl:     ALLERGIES   Ace inhibitors; Ciprofloxacin; Macrodantin [nitrofurantoin macrocrystal]; Novocain [procaine]; and Sulfa antibiotics     REVIEW OF SYSTEMS   Review of Systems  Constitutional: Negative for chills, fever, malaise/fatigue and weight loss.  HENT: Negative for congestion and hearing loss.   Eyes: Negative for blurred vision and double vision.  Respiratory: Positive for cough. Negative for hemoptysis, sputum production, shortness of breath and wheezing.   Cardiovascular: Negative for chest pain, palpitations, orthopnea and leg swelling.  Gastrointestinal: Negative for abdominal pain, heartburn, nausea and  vomiting.  Skin: Negative for rash.  Neurological: Negative for dizziness and headaches.  All other systems reviewed and are negative.  There were no vitals taken for this visit.  BP is high 150/110  BP high 150/110  PHYSICAL EXAM  Physical Exam  Constitutional: She is oriented to person, place, and time. No distress.  Eyes: No scleral icterus.  Neck: Neck supple.  Cardiovascular: Normal rate, regular rhythm and normal heart sounds.  No murmur heard. Pulmonary/Chest: No stridor. No respiratory distress. She has no wheezes. She has rales.  Left lower base rales  Musculoskeletal: Normal range of motion. She exhibits no edema.  Neurological: She is alert and oriented to person, place, and time. No cranial nerve deficit.  Skin: Skin is warm. She is not diaphoretic.  Psychiatric: She has a normal mood and affect.          IMAGING   CT chest reveiwed since 2011 images reveiwed 06/20/10 09/20/10 10/16/13 11/25/13 12/15/14 06/30/15 02/01/2016  ALL  films show slow progressive enlarging  LLL opacity since 2011 Most recent with air bronchogram signs, now B/L opacificiations Etiology unkown  CT chest 11/2017-images reviewed by ME 4.8.19 Multifocal opacities RT and left upper lobes Multiple large areas of airspace opacification/consolidation in BILATERAL upper lobes and RIGHT lower lobe with areas of cavitation identified within LEFT upper lobe and questionably RIGHT upper lobe.  CT chest  ASSESSMENT/PLAN   84 yo white female with extensive envireonemental exposure to [place] and spray paints/porcelin with bronchiectasis based on CT chest findings as well as a persistent LLL opacity Likely chronic inflammation since 2011.  Patient has new RUL/LUL cavitary opacities uncertain etiology, possibly secondary to cancer versus inflammatory pneumonia versus atypical pneumonia.  Discussed in depth with patient and son, she has not responded to typical empiric therapies as would be  expected, therefore the next step will likely require some kind of interventional procedure.  She is not certain whether she would like to proceed with these.  Therefore is a middle ground today I am going to prescribe her a prolonged course of antibiotics and steroids to treat empirically for abscess versus organizing pneumonia.  She will have a repeat CAT scan chest in 4 to 6 weeks, follow-up with Dr. Mortimer Coffey after that.  Patient and family will consider further whether they want further interventional procedures, explained that given her age these would be at higher risk.  Orders Placed This Encounter  Procedures  . CT CHEST WO CONTRAST  . Comp Met (CMET)   Meds ordered this encounter  Medications  . amoxicillin-clavulanate (AUGMENTIN) 875-125 MG tablet    Sig: Take 1 tablet by mouth 2 (two) times daily for 28 days.    Dispense:  56 tablet    Refill:  0  . predniSONE (DELTASONE) 10 MG tablet    Sig: Take 1 tablet (10 mg total) by mouth daily with breakfast. Take 4 tablets daily for one week, then Take  3 tablets daily for one week, then Take 2 tablets daily for one week, then Take 1 tablet daily for one week, then stop.    Dispense:  75 tablet    Refill:  0   Return in about 6 weeks (around 04/19/2018) for with Dr. Mortimer Coffey.   Marda Stalker, MD.   Board Certified in Internal Medicine, Pulmonary Medicine, [place], and Sleep Medicine.  [facility] Office Number: [phone] Pager: [phone]  Christina Coffey, M.D.  Merton Border, M.D

## 2018-03-11 ENCOUNTER — Ambulatory Visit: Payer: Medicare Other | Admitting: Internal Medicine

## 2018-04-11 ENCOUNTER — Ambulatory Visit
Admission: RE | Admit: 2018-04-11 | Discharge: 2018-04-11 | Disposition: A | Discharge status: Home / Self Care | Payer: Medicare Other | Source: Ambulatory Visit | Attending: Internal Medicine | Admitting: Internal Medicine

## 2018-04-11 ENCOUNTER — Telehealth: Payer: Self-pay | Admitting: Internal Medicine

## 2018-04-11 ENCOUNTER — Other Ambulatory Visit
Admission: RE | Admit: 2018-04-11 | Discharge: 2018-04-11 | Disposition: A | Discharge status: Home / Self Care | Payer: Medicare Other | Source: Ambulatory Visit | Attending: Internal Medicine | Admitting: Internal Medicine

## 2018-04-11 DIAGNOSIS — I700 Atherosclerosis of aorta: Secondary | ICD-10-CM | POA: Diagnosis not present

## 2018-04-11 DIAGNOSIS — J984 Other disorders of lung: Secondary | ICD-10-CM | POA: Insufficient documentation

## 2018-04-11 DIAGNOSIS — C3490 Malignant neoplasm of unspecified part of unspecified bronchus or lung: Secondary | ICD-10-CM

## 2018-04-11 DIAGNOSIS — K449 Diaphragmatic hernia without obstruction or gangrene: Secondary | ICD-10-CM | POA: Diagnosis not present

## 2018-04-11 DIAGNOSIS — I2510 Atherosclerotic heart disease of native coronary artery without angina pectoris: Secondary | ICD-10-CM | POA: Insufficient documentation

## 2018-04-11 DIAGNOSIS — J479 Bronchiectasis, uncomplicated: Secondary | ICD-10-CM | POA: Insufficient documentation

## 2018-04-11 DIAGNOSIS — J90 Pleural effusion, not elsewhere classified: Secondary | ICD-10-CM | POA: Diagnosis not present

## 2018-04-11 LAB — COMPREHENSIVE METABOLIC PANEL
ALT: 14 U/L (ref 0–44)
AST: 23 U/L (ref 15–41)
Albumin: 3.7 g/dL (ref 3.5–5.0)
Alkaline Phosphatase: 58 U/L (ref 38–126)
Anion gap: 8 (ref 5–15)
BUN: 12 mg/dL (ref 8–23)
CALCIUM: 9.8 mg/dL (ref 8.9–10.3)
CO2: 30 mmol/L (ref 22–32)
CREATININE: 0.63 mg/dL (ref 0.44–1.00)
Chloride: 101 mmol/L (ref 98–111)
GFR calc non Af Amer: >60 mL/min (ref >60)
Glucose, Bld: 147 mg/dL — ABNORMAL HIGH (ref 70–99)
Potassium: 2.8 mmol/L — ABNORMAL LOW (ref 3.5–5.1)
SODIUM: 139 mmol/L (ref 135–145)
TOTAL PROTEIN: 6 g/dL — AB (ref 6.5–8.1)
Total Bilirubin: 1.2 mg/dL (ref 0.3–1.2)

## 2018-04-11 NOTE — Telephone Encounter (Signed)
Spoke with son in regards to labwork that needs to be drawn. He states they went to [facility] but they stated they didn't have the order. Informed son to take pt to [facility] for [facility]. Son verbalized understanding. Nothing further needed.

## 2018-04-11 NOTE — Telephone Encounter (Signed)
Pt son is calling asking about pt getting labowork. Please call to discuss.

## 2018-04-16 ENCOUNTER — Ambulatory Visit: Payer: Medicare Other | Admitting: Internal Medicine

## 2018-04-22 ENCOUNTER — Encounter: Payer: Self-pay | Admitting: Internal Medicine

## 2018-04-22 ENCOUNTER — Ambulatory Visit: Payer: Medicare Other | Admitting: Internal Medicine

## 2018-04-22 VITALS — BP 110/80 | HR 94 | Resp 16 | Ht 61.0 in | Wt 110.0 lb

## 2018-04-22 DIAGNOSIS — R05 Cough: Secondary | ICD-10-CM | POA: Diagnosis not present

## 2018-04-22 DIAGNOSIS — R9389 Abnormal findings on diagnostic imaging of other specified body structures: Secondary | ICD-10-CM

## 2018-04-22 DIAGNOSIS — R059 Cough, unspecified: Secondary | ICD-10-CM

## 2018-04-22 MED ORDER — ALBUTEROL SULFATE HFA 108 (90 BASE) MCG/ACT IN AERS: 2.0000 | INHALATION_SPRAY | RESPIRATORY_TRACT | 2 refills | Status: DC | PRN

## 2018-04-22 MED ORDER — AMBULATORY NON FORMULARY MEDICATION: 0 refills | Status: DC

## 2018-04-22 NOTE — Progress Notes (Signed)
Christina Coffey      Date: 04/22/2018,   MRN# [id number] Christina Coffey [birth year]     AdmissionWeight: 110 lb (49.9 kg)                 CurrentWeight: 110 lb (49.9 kg) Christina Coffey is a 84 y.o. old female seen in [place] for chronic cough at the request of Dr. Doy Hutching     CHIEF COMPLAINT:   follow up abnormal CT chest    HISTORY OF PRESENT ILLNESS   PREVIOUS OV  84 yo very active white female seen today for mild chronic cough for approx 1.5 years. It is associated with prodcutive cough with white phlegm Patient DOES NOT want any inhalers at this time    Over the past 1.5 years. Patient states that she is very active, works in the yard, she has no SOB no WOB. No chest pain. She denies having fevers, chills, NVD.  No lower ext sweliing  She has had a persistent Left Lower lobe opacity dating back to 2011 Her previous CT scans did mention increasing opacification however,  patient does NOT have any resp symptoms at this time Supposedly, she underwent Bronch with biopsy in Oct 2015 no report found, suggests benign process I have reviewed CT scans since 2011, the most recenty Ct chest 01/2016 reveals increased LLL opacity After further discussion with patient, she would NOT want any procedures of chemo or RXT if this opacity were to be malignant    Current visit CT chest 11/2017 reveal mulitofocal opacities Left and RT lungs with cavitary process  Findings c/w Viral pneumonia complicated by bacterial pneumonia She has symptoms of productive cough and SOB Patient has been on 3 rounds of ABX and most recently on Augmentin and prednisone Cough is a little worse now  Patient has no fevers, chills She is NOT  Toxic appearing She has no SOB but has mild cough  She states that she has no SOB, no DOE No fevers, chills She has no acute issues at this time No signs of infection at this time   I talked with her Son Christina Coffey -present at time of  visit reviewed CT scan findings-there is progressive worsening of nodular opacities and this may be worrisome for malignancy or progressive atypical infection   The Risks and Benefits of the Bronchoscopy procedure with BRONCHOSCOPY  were explained to patient/family.  I have discussed the risk for Acute Bleeding, increased chance of Infection, increased chance of Respiratory Failure and Cardiac Arrest, Stroke and Death.  I have also explained to avoid all types of NSAIDs 1 week prior to procedure date  to decrease chance of bleeding, and also to avoid food and drinks the midnight prior to procedure.  The procedure consists of a video camera with a light source to be placed and inserted  into the lungs to  look for abnormal tissue and to obtain tissue samples by using needle and biopsy tools.  The patient/family understand the risks and benefits and have NOT  agreed to proceed with procedure at this time  They would like to discuss before we proceed.    Current Medication:  Current Outpatient Medications:  .  amLODipine (NORVASC) 5 MG tablet, Take 5 mg by mouth daily., Disp: , Rfl:  .  aspirin 81 MG tablet, Take 81 mg by mouth daily., Disp: , Rfl:  .  losartan (COZAAR) 100 MG tablet, Take 100 mg by mouth daily., Disp: , Rfl:  .  pantoprazole (PROTONIX) 40 MG tablet, Take 40 mg by mouth daily., Disp: , Rfl:     ALLERGIES   Ace inhibitors; Other; Ciprofloxacin; Macrodantin [nitrofurantoin macrocrystal]; Novocain [procaine]; and Sulfa antibiotics     REVIEW OF SYSTEMS   Review of Systems  Constitutional: Negative for chills, fever, malaise/fatigue and weight loss.  HENT: Negative for congestion and hearing loss.   Eyes: Negative for blurred vision and double vision.  Respiratory: Positive for cough. Negative for hemoptysis, sputum production, shortness of breath and wheezing.   Cardiovascular: Negative for chest pain, palpitations, orthopnea and leg swelling.  Gastrointestinal:  Negative for abdominal pain, heartburn, nausea and vomiting.  Skin: Negative for rash.  Neurological: Negative for dizziness and headaches.  All other systems reviewed and are negative.  BP 110/80 (BP Location: Left Arm, Cuff Size: Normal)   Pulse 94   Resp 16   Ht 5\' 1"  (1.549 m)   Wt 110 lb (49.9 kg)   SpO2 93%   BMI 20.78 kg/m   BP is high 150/110  BP high 150/110  PHYSICAL EXAM  Physical Exam  Constitutional: She is oriented to person, place, and time. No distress.  Eyes: No scleral icterus.  Neck: Neck supple.  Cardiovascular: Normal rate, regular rhythm and normal heart sounds.  No murmur heard. Pulmonary/Chest: No stridor. No respiratory distress. She has no wheezes. She has rales.  Left lower base rales  Musculoskeletal: Normal range of motion. She exhibits no edema.  Neurological: She is alert and oriented to person, place, and time. No cranial nerve deficit.  Skin: Skin is warm. She is not diaphoretic.  Psychiatric: She has a normal mood and affect.          IMAGING   CT chest reveiwed since 2011 images reveiwed 06/20/10 09/20/10 10/16/13 11/25/13 12/15/14 06/30/15 02/01/2016  ALL  films show slow progressive enlarging  LLL opacity since 2011 Most recent with air bronchogram signs, now B/L opacificiations Etiology unkown  CT chest 11/2017-images reviewed by ME 4.8.19 Multifocal opacities RT and left upper lobes Multiple large areas of airspace opacification/consolidation in BILATERAL upper lobes and RIGHT lower lobe with areas of cavitation identified within LEFT upper lobe and questionably RIGHT upper lobe.  CT chest 03/2018 Progressive worsening b/l infiltrates  CT chest  ASSESSMENT/PLAN   84 yo white female with extensive envireonemental exposure to [place] and spray paints/porcelin with bronchiectasis based on CT chest findings as well as a persistent LLL opacity Likely chronic inflammation since 2011.  Patient has new RUL/LUL cavitary opacities  most likely s/p viral pneumonia superimposed bacterial pneumonia without signs of sepsis, probable underlying atypical infection/malignancy  Patient is to continue ABX as prescribed  I have discussed findings with Son and we will need to decide on plan of action whether or not to proceed with Bronchoscopy.  The patient is very reluctant to undergo anytype of procedure at this time.  In the meantime, I have advised albuterol inh as needed along with flutter valve and incentive spirometry 10-15 times per day  Will await for final decision from patient and family about Bronchoscopy   Patient/SON  satisfied with Plan of action and management. All questions answered  Corrin Parker, M.D.  [facility]  Medical Director [place] Director [facility] Cardio-Pulmonary Department

## 2018-04-22 NOTE — Patient Instructions (Signed)
Start Albuterol as needed Start Flutter Valve 10 -15 times per day Incentive Spirometry 10-15 times per day

## 2018-05-14 ENCOUNTER — Inpatient Hospital Stay: Payer: Medicare Other

## 2018-05-14 ENCOUNTER — Other Ambulatory Visit: Payer: Self-pay

## 2018-05-14 ENCOUNTER — Inpatient Hospital Stay
Admission: EM | Admit: 2018-05-14 | Discharge: 2018-05-17 | DRG: 193 | Disposition: A | Discharge status: Skilled Nursing Facility | Payer: Medicare Other | Attending: Internal Medicine | Admitting: Internal Medicine

## 2018-05-14 ENCOUNTER — Encounter: Payer: Self-pay | Admitting: Emergency Medicine

## 2018-05-14 ENCOUNTER — Emergency Department: Payer: Medicare Other

## 2018-05-14 DIAGNOSIS — Z882 Allergy status to sulfonamides status: Secondary | ICD-10-CM

## 2018-05-14 DIAGNOSIS — R5383 Other fatigue: Secondary | ICD-10-CM | POA: Diagnosis not present

## 2018-05-14 DIAGNOSIS — Z881 Allergy status to other antibiotic agents status: Secondary | ICD-10-CM

## 2018-05-14 DIAGNOSIS — R739 Hyperglycemia, unspecified: Secondary | ICD-10-CM | POA: Diagnosis present

## 2018-05-14 DIAGNOSIS — R410 Disorientation, unspecified: Secondary | ICD-10-CM | POA: Diagnosis not present

## 2018-05-14 DIAGNOSIS — R918 Other nonspecific abnormal finding of lung field: Secondary | ICD-10-CM | POA: Diagnosis not present

## 2018-05-14 DIAGNOSIS — Z7982 Long term (current) use of aspirin: Secondary | ICD-10-CM

## 2018-05-14 DIAGNOSIS — C3490 Malignant neoplasm of unspecified part of unspecified bronchus or lung: Secondary | ICD-10-CM | POA: Diagnosis present

## 2018-05-14 DIAGNOSIS — I10 Essential (primary) hypertension: Secondary | ICD-10-CM | POA: Diagnosis present

## 2018-05-14 DIAGNOSIS — Z79899 Other long term (current) drug therapy: Secondary | ICD-10-CM

## 2018-05-14 DIAGNOSIS — Z884 Allergy status to anesthetic agent status: Secondary | ICD-10-CM | POA: Diagnosis not present

## 2018-05-14 DIAGNOSIS — R531 Weakness: Secondary | ICD-10-CM | POA: Diagnosis not present

## 2018-05-14 DIAGNOSIS — E860 Dehydration: Secondary | ICD-10-CM | POA: Diagnosis present

## 2018-05-14 DIAGNOSIS — K5900 Constipation, unspecified: Secondary | ICD-10-CM | POA: Diagnosis present

## 2018-05-14 DIAGNOSIS — I4510 Unspecified right bundle-branch block: Secondary | ICD-10-CM | POA: Diagnosis present

## 2018-05-14 DIAGNOSIS — J189 Pneumonia, unspecified organism: Secondary | ICD-10-CM | POA: Diagnosis present

## 2018-05-14 DIAGNOSIS — Z6820 Body mass index (BMI) 20.0-20.9, adult: Secondary | ICD-10-CM

## 2018-05-14 DIAGNOSIS — R627 Adult failure to thrive: Secondary | ICD-10-CM | POA: Diagnosis present

## 2018-05-14 DIAGNOSIS — Z7189 Other specified counseling: Secondary | ICD-10-CM

## 2018-05-14 DIAGNOSIS — R7301 Impaired fasting glucose: Secondary | ICD-10-CM | POA: Diagnosis present

## 2018-05-14 DIAGNOSIS — Z66 Do not resuscitate: Secondary | ICD-10-CM | POA: Diagnosis present

## 2018-05-14 DIAGNOSIS — Z8249 Family history of ischemic heart disease and other diseases of the circulatory system: Secondary | ICD-10-CM | POA: Diagnosis not present

## 2018-05-14 DIAGNOSIS — Z90710 Acquired absence of both cervix and uterus: Secondary | ICD-10-CM | POA: Diagnosis not present

## 2018-05-14 DIAGNOSIS — Z87891 Personal history of nicotine dependence: Secondary | ICD-10-CM

## 2018-05-14 DIAGNOSIS — G9341 Metabolic encephalopathy: Secondary | ICD-10-CM | POA: Diagnosis present

## 2018-05-14 DIAGNOSIS — Z8701 Personal history of pneumonia (recurrent): Secondary | ICD-10-CM | POA: Diagnosis not present

## 2018-05-14 DIAGNOSIS — Z888 Allergy status to other drugs, medicaments and biological substances status: Secondary | ICD-10-CM

## 2018-05-14 DIAGNOSIS — E876 Hypokalemia: Secondary | ICD-10-CM | POA: Diagnosis present

## 2018-05-14 DIAGNOSIS — E43 Unspecified severe protein-calorie malnutrition: Secondary | ICD-10-CM | POA: Diagnosis present

## 2018-05-14 DIAGNOSIS — R0902 Hypoxemia: Secondary | ICD-10-CM | POA: Diagnosis present

## 2018-05-14 DIAGNOSIS — R9389 Abnormal findings on diagnostic imaging of other specified body structures: Secondary | ICD-10-CM | POA: Diagnosis not present

## 2018-05-14 DIAGNOSIS — K219 Gastro-esophageal reflux disease without esophagitis: Secondary | ICD-10-CM | POA: Diagnosis present

## 2018-05-14 DIAGNOSIS — Z515 Encounter for palliative care: Secondary | ICD-10-CM | POA: Diagnosis not present

## 2018-05-14 HISTORY — DX: Pneumonia, unspecified organism: J18.9

## 2018-05-14 LAB — CBC
HEMATOCRIT: 47.1 % — AB (ref 35.0–47.0)
Hemoglobin: 16.5 g/dL — ABNORMAL HIGH (ref 12.0–16.0)
MCH: 32.5 pg (ref 26.0–34.0)
MCHC: 35 g/dL (ref 32.0–36.0)
MCV: 92.8 fL (ref 80.0–100.0)
Platelets: 267 10*3/uL (ref 150–440)
RBC: 5.08 MIL/uL (ref 3.80–5.20)
RDW: 14.4 % (ref 11.5–14.5)
WBC: 12.5 10*3/uL — ABNORMAL HIGH (ref 3.6–11.0)

## 2018-05-14 LAB — HEMOGLOBIN A1C
HEMOGLOBIN A1C: 5.4 % (ref 4.8–5.6)
MEAN PLASMA GLUCOSE: 108.28 mg/dL

## 2018-05-14 LAB — BASIC METABOLIC PANEL
Anion gap: 6 (ref 5–15)
BUN: 14 mg/dL (ref 8–23)
CO2: 29 mmol/L (ref 22–32)
Calcium: 12.7 mg/dL — ABNORMAL HIGH (ref 8.9–10.3)
Chloride: 103 mmol/L (ref 98–111)
Creatinine, Ser: 0.75 mg/dL (ref 0.44–1.00)
GFR calc Af Amer: >60 mL/min (ref >60)
GLUCOSE: 167 mg/dL — AB (ref 70–99)
POTASSIUM: 2.6 mmol/L — AB (ref 3.5–5.1)
Sodium: 138 mmol/L (ref 135–145)

## 2018-05-14 MED ORDER — INSULIN ASPART 100 UNIT/ML ~~LOC~~ SOLN: 0.0000 [IU] | Freq: Every day | SUBCUTANEOUS | Status: DC

## 2018-05-14 MED ORDER — VANCOMYCIN HCL IN DEXTROSE 1-5 GM/200ML-% IV SOLN
1000.0000 mg | Freq: Once | INTRAVENOUS | Status: AC
  Administered 2018-05-14: 1000 mg via INTRAVENOUS
  Filled 2018-05-14: qty 200

## 2018-05-14 MED ORDER — INSULIN ASPART 100 UNIT/ML ~~LOC~~ SOLN
0.0000 [IU] | Freq: Three times a day (TID) | SUBCUTANEOUS | Status: DC
  Administered 2018-05-15: 2 [IU] via SUBCUTANEOUS
  Administered 2018-05-16: 1 [IU] via SUBCUTANEOUS
  Filled 2018-05-14 (×2): qty 1

## 2018-05-14 MED ORDER — POTASSIUM CHLORIDE 2 MEQ/ML IV SOLN
INTRAVENOUS | Status: DC
  Administered 2018-05-15 – 2018-05-17 (×4): via INTRAVENOUS
  Filled 2018-05-14 (×8): qty 1000

## 2018-05-14 MED ORDER — BUDESONIDE 0.5 MG/2ML IN SUSP
0.5000 mg | Freq: Two times a day (BID) | RESPIRATORY_TRACT | Status: DC
  Administered 2018-05-15 – 2018-05-17 (×5): 0.5 mg via RESPIRATORY_TRACT
  Filled 2018-05-14 (×5): qty 2

## 2018-05-14 MED ORDER — POTASSIUM CHLORIDE CRYS ER 20 MEQ PO TBCR
40.0000 meq | EXTENDED_RELEASE_TABLET | Freq: Once | ORAL | Status: AC
  Administered 2018-05-14: 40 meq via ORAL
  Filled 2018-05-14: qty 2

## 2018-05-14 MED ORDER — VANCOMYCIN HCL IN DEXTROSE 750-5 MG/150ML-% IV SOLN
750.0000 mg | INTRAVENOUS | Status: DC
  Filled 2018-05-14: qty 150

## 2018-05-14 MED ORDER — PANTOPRAZOLE SODIUM 40 MG PO TBEC
40.0000 mg | DELAYED_RELEASE_TABLET | Freq: Every day | ORAL | Status: DC
  Administered 2018-05-15 – 2018-05-17 (×3): 40 mg via ORAL
  Filled 2018-05-14 (×3): qty 1

## 2018-05-14 MED ORDER — GADOBENATE DIMEGLUMINE 529 MG/ML IV SOLN
10.0000 mL | Freq: Once | INTRAVENOUS | Status: AC | PRN
  Administered 2018-05-15: 9 mL via INTRAVENOUS

## 2018-05-14 MED ORDER — SODIUM CHLORIDE 0.9 % IV SOLN
2.0000 g | Freq: Once | INTRAVENOUS | Status: AC
  Administered 2018-05-14: 2 g via INTRAVENOUS
  Filled 2018-05-14: qty 2

## 2018-05-14 MED ORDER — SODIUM CHLORIDE 0.9 % IV SOLN
500.0000 mg | INTRAVENOUS | Status: DC
  Administered 2018-05-14 – 2018-05-15 (×2): 500 mg via INTRAVENOUS
  Filled 2018-05-14 (×3): qty 500

## 2018-05-14 MED ORDER — KCL-LACTATED RINGERS 20 MEQ/L IV SOLN: INTRAVENOUS | Status: DC

## 2018-05-14 MED ORDER — POTASSIUM CHLORIDE CRYS ER 20 MEQ PO TBCR
40.0000 meq | EXTENDED_RELEASE_TABLET | Freq: Two times a day (BID) | ORAL | Status: DC
  Filled 2018-05-14: qty 2

## 2018-05-14 MED ORDER — ACETAMINOPHEN 650 MG RE SUPP: 650.0000 mg | Freq: Four times a day (QID) | RECTAL | Status: DC | PRN

## 2018-05-14 MED ORDER — AMLODIPINE BESYLATE 5 MG PO TABS: 5.0000 mg | ORAL_TABLET | ORAL | Status: DC

## 2018-05-14 MED ORDER — METHYLPREDNISOLONE SODIUM SUCC 40 MG IJ SOLR
40.0000 mg | Freq: Every day | INTRAMUSCULAR | Status: DC
  Administered 2018-05-15 – 2018-05-16 (×2): 40 mg via INTRAVENOUS
  Filled 2018-05-14 (×2): qty 1

## 2018-05-14 MED ORDER — IPRATROPIUM-ALBUTEROL 0.5-2.5 (3) MG/3ML IN SOLN
3.0000 mL | Freq: Four times a day (QID) | RESPIRATORY_TRACT | Status: DC
  Administered 2018-05-15 – 2018-05-17 (×10): 3 mL via RESPIRATORY_TRACT
  Filled 2018-05-14 (×10): qty 3

## 2018-05-14 MED ORDER — LOSARTAN POTASSIUM 50 MG PO TABS
100.0000 mg | ORAL_TABLET | Freq: Every day | ORAL | Status: DC
  Administered 2018-05-15 – 2018-05-17 (×3): 100 mg via ORAL
  Filled 2018-05-14 (×3): qty 2

## 2018-05-14 MED ORDER — SODIUM CHLORIDE 0.9 % IV SOLN: Freq: Once | INTRAVENOUS | Status: DC

## 2018-05-14 MED ORDER — ACETAMINOPHEN 325 MG PO TABS: 650.0000 mg | ORAL_TABLET | Freq: Four times a day (QID) | ORAL | Status: DC | PRN

## 2018-05-14 MED ORDER — SODIUM CHLORIDE 0.9 % IV SOLN
2.0000 g | Freq: Two times a day (BID) | INTRAVENOUS | Status: DC
  Administered 2018-05-15 – 2018-05-16 (×3): 2 g via INTRAVENOUS
  Filled 2018-05-14 (×5): qty 2

## 2018-05-14 MED ORDER — AMLODIPINE BESYLATE 5 MG PO TABS
5.0000 mg | ORAL_TABLET | Freq: Every day | ORAL | Status: DC
  Administered 2018-05-15 – 2018-05-17 (×3): 5 mg via ORAL
  Filled 2018-05-14 (×3): qty 1

## 2018-05-14 MED ORDER — MAGNESIUM SULFATE 2 GM/50ML IV SOLN: 2.0000 g | Freq: Once | INTRAVENOUS | Status: DC

## 2018-05-14 NOTE — ED Triage Notes (Signed)
Pt in via POV with complaints of rapid deterioration x one week, pt with severe weakness, with difficulty ambulating and performing basic ADL's.    Recent appointment with PCP x one week ago found multiple lung masses, pt not a candidate for broncoscopy at this time.  Pt advised per PCP to be evaluated here if not improving with new medications.  Vitals WDL.

## 2018-05-14 NOTE — ED Provider Notes (Signed)
[facility] Provider Note    First MD Initiated Contact with Patient 05/14/18 1912     (approximate)  I have reviewed the triage vital signs and the nursing notes.   HISTORY  Chief Complaint Weakness    HPI Christina Coffey is a 84 y.o. female no history of chronic lung disease presents with worsening shortness of breath as well as significant exertional dyspnea fatigue and weakness over the past week.  Patient had influenza illness in the winter complicated by post viral bacterial pneumonia.  She has been on courses of antibiotics as an outpatient without significant improvement.  Does have a remote history of smoking.  CT imaging done after referral to pulmonologist did show evidence of multilobar consolidation with very broad differential and GGO's in both lung fields.  They discussed bronchoscopy at that point but as of the end of last month the plan was for further discussion of risks and benefits with family to determine goals of care.    Past Medical History:  Diagnosis Date  . Arthritis   . GERD (gastroesophageal reflux disease)   . Hypertension    Family History  Problem Relation Age of Onset  . Breast cancer Neg Hx    Past Surgical History:  Procedure Laterality Date  . ABDOMINAL HYSTERECTOMY    . ANKLE FRACTURE SURGERY     with titanium plates & screws  right  . BACK SURGERY    . FOOT SURGERY     she thinks both....mainly bunions   Patient Active Problem List   Diagnosis Date Noted  . Bronchiectasis ([place]) 02/29/2016  . Spondylolisthesis at L4-L5 level 07/23/2014      Prior to Admission medications   Medication Sig Start Date End Date Taking? Authorizing Provider  albuterol (PROVENTIL HFA;VENTOLIN HFA) 108 (90 Base) MCG/ACT inhaler Inhale 2 puffs into the lungs every 4 (four) hours as needed for wheezing or shortness of breath. 04/22/18   Flora Lipps, MD  AMBULATORY NON FORMULARY MEDICATION Medication Name:  Incentive spirometer. Use 10-15 times per day DX:J49.9 04/22/18   Flora Lipps, MD  AMBULATORY NON FORMULARY MEDICATION Medication Name: Flutter valve Use 10-15 times per day. DX:J49.9 04/22/18   Flora Lipps, MD  amLODipine (NORVASC) 5 MG tablet Take 5 mg by mouth daily.    [provider]  aspirin 81 MG tablet Take 81 mg by mouth daily.    [provider]  losartan (COZAAR) 100 MG tablet Take 100 mg by mouth daily.    [provider]  pantoprazole (PROTONIX) 40 MG tablet Take 40 mg by mouth daily.    [provider]    Allergies Ace inhibitors; Other; Ciprofloxacin; Macrodantin [nitrofurantoin macrocrystal]; Novocain [procaine]; and Sulfa antibiotics    Social History Social History   Tobacco Use  . Smoking status: Former Smoker    Packs/day: 1.00    Years: 38.00    Pack years: 38.00  . Smokeless tobacco: Former Systems developer    Quit date: 07/16/1982  Substance Use Topics  . Alcohol use: Yes    Alcohol/week: 35.0 standard drinks    Types: 35 Glasses of wine per week    Comment: wine  . Drug use: No    Review of Systems Patient denies headaches, rhinorrhea, blurry vision, numbness, shortness of breath, chest pain, edema, cough, abdominal pain, nausea, vomiting, diarrhea, dysuria, fevers, rashes or hallucinations unless otherwise stated above in HPI. ____________________________________________   PHYSICAL EXAM:  VITAL SIGNS: Vitals:   05/14/18 1828  BP: (!) 159/90  Pulse: 88  Resp: 15  Temp: 97.7 F (36.5 C)  SpO2: 94%    Constitutional: Alert and oriented.  Very frail and fatigued appearing  Eyes: Conjunctivae are normal.  Head: Atraumatic. Nose: No congestion/rhinnorhea. Mouth/Throat: Mucous membranes are moist.   Neck: No stridor. Painless ROM.  Cardiovascular: Normal rate, regular rhythm. Grossly normal heart sounds.  Good peripheral circulation. Respiratory: mild tachypnea, speaking in short phrases, + use of accessory  muscless.  RUL anterior crackles and diminished BS in left base Gastrointestinal: Soft and nontender. No distention. No abdominal bruits. No CVA tenderness. Genitourinary: deferredd Musculoskeletal: No lower extremity tenderness nor edema.  No joint effusions. Neurologic:  Normal speech and language. No gross focal neurologic deficits are appreciated. No facial droop Skin:  Skin is warm, dry and intact. No rash noted. Psychiatric: Mood and affect are normal. Speech and behavior are normal.  ____________________________________________   LABS (all labs ordered are listed, but only abnormal results are displayed)  Results for orders placed or performed during the hospital encounter of 05/14/18 (from the past 24 hour(s))  Basic metabolic panel     Status: Abnormal   Collection Time: 05/14/18  6:34 PM  Result Value Ref Range   Sodium 138 135 - 145 mmol/L   Potassium 2.6 (LL) 3.5 - 5.1 mmol/L   Chloride 103 98 - 111 mmol/L   CO2 29 22 - 32 mmol/L   Glucose, Bld 167 (H) 70 - 99 mg/dL   BUN 14 8 - 23 mg/dL   Creatinine, Ser 0.75 0.44 - 1.00 mg/dL   Calcium 12.7 (H) 8.9 - 10.3 mg/dL   GFR calc non Af Amer >60 >60 mL/min   GFR calc Af Amer >60 >60 mL/min   Anion gap 6 5 - 15  CBC     Status: Abnormal   Collection Time: 05/14/18  6:34 PM  Result Value Ref Range   WBC 12.5 (H) 3.6 - 11.0 K/uL   RBC 5.08 3.80 - 5.20 MIL/uL   Hemoglobin 16.5 (H) 12.0 - 16.0 g/dL   HCT 47.1 (H) 35.0 - 47.0 %   MCV 92.8 80.0 - 100.0 fL   MCH 32.5 26.0 - 34.0 pg   MCHC 35.0 32.0 - 36.0 g/dL   RDW 14.4 11.5 - 14.5 %   Platelets 267 150 - 440 K/uL   ____________________________________________  EKG My review and personal interpretation at Time: 18:38   Indication: weakness  Rate: 85  Rhythm: sinus Axis: normal Other: rbbb, no stemi ____________________________________________  RADIOLOGY  I personally reviewed all radiographic images ordered to evaluate for the above acute complaints and reviewed  radiology reports and findings.  These findings were personally discussed with the patient.  Please see medical record for radiology report.  ____________________________________________   PROCEDURES  Procedure(s) performed:  Procedures    Critical Care performed: no ____________________________________________   INITIAL IMPRESSION / ASSESSMENT AND PLAN / ED COURSE  Pertinent labs & imaging results that were available during my care of the patient were reviewed by me and considered in my medical decision making (see chart for details).   DDX: Asthma, copd, CHF, pna, ptx, malignancy, Pe, anemia   Kinnley P Estabrooks is a 84 y.o. who presents to the ED with symptoms as described above.  Patient arrives with complex recent history.  She is currently afebrile but very weak and fatigued appearing.  Mild hypoxia on room air but not requiring spinal oxygenation.  Does have evidence of acute hypokalemia.  X-ray ordered for the above differential does show evidence of multilobar validation's.  Patient has tried outpatient antibiotics without improvement.  Certainly concerning for malignancy.  Had evaluated him and discussed plan for possible bronchoscopy as an outpatient.  Family was under the impression that they were told that they were recommended against having bronchoscopy but on further discussion with them they state that if bronchoscopy would help in diagnosis and evaluate for possible reversible causes that they would be amenable to this.  Based on her presentation for an significant deterioration over the past week will start on broad-spectrum antibiotics as well as continue IV hydration and replete serum potassium.      As part of my medical decision making, I reviewed the following data within the [facility] notes reviewed and incorporated, Labs reviewed, notes from prior ED visits.   ____________________________________________   FINAL CLINICAL IMPRESSION(S) /  ED DIAGNOSES  Final diagnoses:  Pneumonia of both lungs due to infectious organism, unspecified part of lung  Weakness  Hypokalemia      NEW MEDICATIONS STARTED DURING THIS VISIT:  New Prescriptions   No medications on file     Note:  This document was prepared using Dragon voice recognition software and may include unintentional dictation errors.    Merlyn Lot, MD 05/14/18 2046

## 2018-05-14 NOTE — H&P (Signed)
[facility] [place] NAME: Christina Coffey    MR#:  [id number]  DATE OF BIRTH:  04/16/1927  DATE OF ADMISSION:  05/14/2018  PRIMARY CARE PHYSICIAN: Idelle Crouch, MD   REQUESTING/REFERRING PHYSICIAN: Dr Merlyn Lot  CHIEF COMPLAINT:   Chief Complaint  Patient presents with  . Weakness    HISTORY OF PRESENT ILLNESS:  Christina Coffey  is a 84 y.o. female with a known history of recurrent pneumonia that has not gotten better.  Started with having the flu and pneumonia back in March.  She had a course of antibiotics at that time and another course afterwards.  She saw Dr. Mortimer Fries last month and prescribed 30 days of Augmentin.  Still not getting any better.  This past week she is got progressively weaker and weaker.  Normally she lives alone and was able to walk by herself but this is not the case this past week.  She has been more confused.  Still coughing.  She is lost 20 pounds of weight.  She is very fatigued.  She is short of breath.  In the ER her calcium was found to be high her potassium was low and chest x-ray showing bilateral infiltrates.  PAST MEDICAL HISTORY:   Past Medical History:  Diagnosis Date  . Arthritis   . GERD (gastroesophageal reflux disease)   . Hypertension   . Pneumonia     PAST SURGICAL HISTORY:   Past Surgical History:  Procedure Laterality Date  . ABDOMINAL HYSTERECTOMY    . ANKLE FRACTURE SURGERY     with titanium plates & screws  right  . BACK SURGERY    . FOOT SURGERY     she thinks both....mainly bunions    SOCIAL HISTORY:   Social History   Tobacco Use  . Smoking status: Former Smoker    Packs/day: 1.00    Years: 38.00    Pack years: 38.00  . Smokeless tobacco: Former Systems developer    Quit date: 07/16/1982  Substance Use Topics  . Alcohol use: Yes    Alcohol/week: 35.0 standard drinks    Types: 35 Glasses of wine per week    Comment: wine    FAMILY HISTORY:   Family History  Problem  Relation Age of Onset  . CVA Mother   . Colon cancer Mother   . Heart failure Father   . Breast cancer Neg Hx     DRUG ALLERGIES:   Allergies  Allergen Reactions  . Ace Inhibitors Cough  . Other Other (See Comments)    SHAKING/TREMORS  . Ciprofloxacin Palpitations  . Macrodantin [Nitrofurantoin Macrocrystal] Palpitations  . Novocain [Procaine] Palpitations  . Sulfa Antibiotics Palpitations    REVIEW OF SYSTEMS:  CONSTITUTIONAL: No fever, chills or sweats.  Positive for weight loss 20 pounds, positive for fatigue and weakness.  EYES: Wears glasses and has glaucoma EARS, NOSE, AND THROAT: No tinnitus or ear pain. No sore throat RESPIRATORY: Positive for cough and shortness of breath, no wheezing or hemoptysis.  CARDIOVASCULAR: No chest pain, orthopnea, edema.  GASTROINTESTINAL: Some nausea and vomiting.  No diarrhea or abdominal pain. No blood in bowel movements.  Positive for constipation GENITOURINARY: No dysuria, hematuria.  ENDOCRINE: No polyuria, nocturia,  HEMATOLOGY: No anemia, easy bruising or bleeding SKIN: No rash or lesion. MUSCULOSKELETAL: No joint pain or arthritis.   NEUROLOGIC: No tingling, numbness.  PSYCHIATRY: No anxiety or depression.   MEDICATIONS AT HOME:   Prior to Admission medications  Medication Sig Start Date End Date Taking? Authorizing Provider  albuterol (PROVENTIL HFA;VENTOLIN HFA) 108 (90 Base) MCG/ACT inhaler Inhale 2 puffs into the lungs every 4 (four) hours as needed for wheezing or shortness of breath. 04/22/18   Flora Lipps, MD  AMBULATORY NON FORMULARY MEDICATION Medication Name: Incentive spirometer. Use 10-15 times per day DX:J49.9 04/22/18   Flora Lipps, MD  AMBULATORY NON FORMULARY MEDICATION Medication Name: Flutter valve Use 10-15 times per day. DX:J49.9 04/22/18   Flora Lipps, MD  amLODipine (NORVASC) 5 MG tablet Take 5 mg by mouth daily.    [provider]  aspirin 81 MG tablet Take 81 mg by mouth daily.    [provider]  losartan (COZAAR) 100 MG tablet Take 100 mg by mouth daily.    [provider]  pantoprazole (PROTONIX) 40 MG tablet Take 40 mg by mouth daily.    [provider]      VITAL SIGNS:  Blood pressure (!) 185/71, pulse 74, temperature 97.7 F (36.5 C), temperature source Oral, resp. rate (!) 30, height 5\' 1"  (1.549 m), weight 49 kg, SpO2 95 %.  PHYSICAL EXAMINATION:  GENERAL:  84 y.o.-year-old patient lying in the bed with no acute distress.  EYES: Pupils equal, round, reactive to light and accommodation. No scleral icterus. Extraocular muscles intact.  HEENT: Head atraumatic, normocephalic. Oropharynx and nasopharynx clear.  NECK:  Supple, no jugular venous distention. No thyroid enlargement, no tenderness.  LUNGS: Decreased breath sounds bilaterally, positive wheezing throughout entire lung field.  Scattered rhonchi. No use of accessory muscles of respiration.  CARDIOVASCULAR: S1, S2 normal. No murmurs, rubs, or gallops.  ABDOMEN: Soft, nontender, nondistended. Bowel sounds present. No organomegaly or mass.  EXTREMITIES: No pedal edema, cyanosis, or clubbing.  NEUROLOGIC: Cranial nerves II through XII are intact. Muscle strength 5/5 in all extremities. Sensation intact. Gait not checked.  PSYCHIATRIC: The patient is alert and answers some questions but does have some confusion.Marland Kitchen  SKIN: No rash, lesion, or ulcer.   LABORATORY PANEL:   CBC Recent Labs  Lab 05/14/18 1834  WBC 12.5*  HGB 16.5*  HCT 47.1*  PLT 267   ------------------------------------------------------------------------------------------------------------------  Chemistries  Recent Labs  Lab 05/14/18 1834  NA 138  K 2.6*  CL 103  CO2 29  GLUCOSE 167*  BUN 14  CREATININE 0.75  CALCIUM 12.7*   ------------------------------------------------------------------------------------------------------------------    RADIOLOGY:  Dg Chest 2 View  Result Date:  05/14/2018 CLINICAL DATA:  Multiple lung masses. EXAM: CHEST - 2 VIEW COMPARISON:  CT chest 04/11/2018 and 12/19/2017. FINDINGS: Extensive bilateral airspace opacities are worse on the left and persist. Aeration in the left lower lobe appears worse than on the prior examination. Cardiac silhouette is obscured. No pneumothorax or pleural fluid. Aortic atherosclerosis is noted. IMPRESSION: Extensive bilateral airspace disease is worse on the left where it has progressed since the most recent examination. Findings could be due to multifocal pneumonia and/or carcinoma. Electronically Signed   By: Inge Rise M.D.   On: 05/14/2018 19:47    EKG:   Normal sinus rhythm 86 bpm, right bundle branch block, left posterior fascicular block.  IMPRESSION AND PLAN:   1.  Multifocal pneumonia despite numerous courses of antibiotics.  Vancomycin, cefepime and Zithromax ordered.  Will send off procalcitonin and MRSA PCA.  Sputum culture.  Pulmonary consultation for consideration for bronchoscopy. 2.  Severe hypercalcemia.  IV fluid hydration.  Unclear if this is dehydration related or a cancerous process.  Send off a  PTH and PTH related protein.  The patient does look dehydrated with hemoconcentration and a slightly elevated creatinine from her baseline. 3.  Hypokalemia.  Replace potassium orally and an IV fluids. 4.  Acute encephalopathy.  MRI of the brain with contrast to rule out metastases. 5.  Impaired fasting glucose put on sliding scale 6.  Hypertension on Norvasc and losartan.  All the records are reviewed and case discussed with ED provider. Management plans discussed with the patient, family and they are in agreement.  CODE STATUS: Full code  TOTAL TIME TAKING CARE OF THIS PATIENT: 50 minutes, including ACP time.    Loletha Grayer M.D on 05/14/2018 at 9:31 PM  Between 7am to 6pm - Pager - [phone]  After 6pm call admission pager [phone]  [facility]   [phone]  CC: Primary care physician; Idelle Crouch, MD

## 2018-05-14 NOTE — ED Notes (Signed)
Crystal, RN given report.

## 2018-05-14 NOTE — Progress Notes (Signed)
Patient off the floor to MRI, orderly transporting

## 2018-05-14 NOTE — Progress Notes (Signed)
Report received from [facility], ED RN  Patient to go to to MRI after arriving to floor.

## 2018-05-14 NOTE — ED Notes (Signed)
This nurse unable to get second culture. Will call lab to obtain and hold antibiotics until after collection of second blood culture.

## 2018-05-14 NOTE — ED Notes (Signed)
Pt reports using bathroom prior to arrival, unable to provide specimen at this time.

## 2018-05-14 NOTE — Progress Notes (Signed)
Patient ID: Christina Coffey, female   DOB: 1926/11/03, 84 y.o.   MRN: [id number]  ACP note  Patient and 2 sons present  Diagnosis: Persistent multifocal pneumonia, severe hypercalcemia, hypokalemia, acute encephalopathy, impaired fasting glucose, hypertension  CODE STATUS discussed and patient wishes to be a full code.  Plan.  Patient placed on triple antibiotics.  IV fluid hydration.  Check PTH and PTH related protein, replace potassium, MRI of the brain, physical therapy evaluation  Time spent on ACP discussion 18 minutes Dr. Loletha Grayer

## 2018-05-14 NOTE — Progress Notes (Signed)
Pharmacy Antibiotic Note  Christina Coffey is a 84 y.o. female admitted on 05/14/2018 with pneumonia.  Pharmacy has been consulted for vancomycin and cefepime dosing.  Plan: DW 49kg  Vd 34L kei 0.034 hr-1  T1/2 20 hours Vancomycin 750 mg q 24 hours ordered with stacked dosing, Level before 5th dose. Goal trough 15-20  Cefepime 2 grams q 12 hours ordered.  Height: 5\' 1"  (154.9 cm) Weight: 108 lb (49 kg) IBW/kg (Calculated) : 47.8  Temp (24hrs), Avg:97.7 F (36.5 C), Min:97.7 F (36.5 C), Max:97.7 F (36.5 C)  Recent Labs  Lab 05/14/18 1834  WBC 12.5*  CREATININE 0.75    Estimated Creatinine Clearance: 36 mL/min (by C-G formula based on SCr of 0.75 mg/dL).    Allergies  Allergen Reactions  . Ace Inhibitors Cough  . Other Other (See Comments)    SHAKING/TREMORS  . Ciprofloxacin Palpitations  . Macrodantin [Nitrofurantoin Macrocrystal] Palpitations  . Novocain [Procaine] Palpitations  . Sulfa Antibiotics Palpitations    Antimicrobials this admission: Vancomycin, cefepime, azithromycin 8/20  >>    >>   Dose adjustments this admission:   Microbiology results: 8/20 BCx: pending 8/20 Sputum: pending  8/20 MRSA PCR: pending      8/20 CXR: bilateral opacities L>R Thank you for allowing pharmacy to be a part of this patient's care.  Shawnay Bramel S 05/14/2018 10:23 PM

## 2018-05-15 DIAGNOSIS — I10 Essential (primary) hypertension: Secondary | ICD-10-CM

## 2018-05-15 DIAGNOSIS — Z8701 Personal history of pneumonia (recurrent): Secondary | ICD-10-CM

## 2018-05-15 DIAGNOSIS — E43 Unspecified severe protein-calorie malnutrition: Secondary | ICD-10-CM

## 2018-05-15 DIAGNOSIS — Z888 Allergy status to other drugs, medicaments and biological substances status: Secondary | ICD-10-CM

## 2018-05-15 DIAGNOSIS — R531 Weakness: Secondary | ICD-10-CM

## 2018-05-15 DIAGNOSIS — J189 Pneumonia, unspecified organism: Principal | ICD-10-CM

## 2018-05-15 DIAGNOSIS — R918 Other nonspecific abnormal finding of lung field: Secondary | ICD-10-CM

## 2018-05-15 DIAGNOSIS — Z87891 Personal history of nicotine dependence: Secondary | ICD-10-CM

## 2018-05-15 DIAGNOSIS — R410 Disorientation, unspecified: Secondary | ICD-10-CM

## 2018-05-15 DIAGNOSIS — R5383 Other fatigue: Secondary | ICD-10-CM

## 2018-05-15 DIAGNOSIS — R739 Hyperglycemia, unspecified: Secondary | ICD-10-CM

## 2018-05-15 DIAGNOSIS — K5900 Constipation, unspecified: Secondary | ICD-10-CM

## 2018-05-15 DIAGNOSIS — E876 Hypokalemia: Secondary | ICD-10-CM

## 2018-05-15 DIAGNOSIS — Z882 Allergy status to sulfonamides status: Secondary | ICD-10-CM

## 2018-05-15 DIAGNOSIS — Z884 Allergy status to anesthetic agent status: Secondary | ICD-10-CM

## 2018-05-15 DIAGNOSIS — Z881 Allergy status to other antibiotic agents status: Secondary | ICD-10-CM

## 2018-05-15 LAB — CBC
HCT: 41.6 % (ref 35.0–47.0)
Hemoglobin: 14.7 g/dL (ref 12.0–16.0)
MCH: 32.8 pg (ref 26.0–34.0)
MCHC: 35.3 g/dL (ref 32.0–36.0)
MCV: 93 fL (ref 80.0–100.0)
PLATELETS: 213 10*3/uL (ref 150–440)
RBC: 4.48 MIL/uL (ref 3.80–5.20)
RDW: 14.1 % (ref 11.5–14.5)
WBC: 10.2 10*3/uL (ref 3.6–11.0)

## 2018-05-15 LAB — GLUCOSE, CAPILLARY
GLUCOSE-CAPILLARY: 78 mg/dL (ref 70–99)
GLUCOSE-CAPILLARY: 166 mg/dL — AB (ref 70–99)
GLUCOSE-CAPILLARY: 156 mg/dL — AB (ref 70–99)
Glucose-Capillary: 84 mg/dL (ref 70–99)

## 2018-05-15 LAB — BASIC METABOLIC PANEL
Anion gap: 5 (ref 5–15)
BUN: 11 mg/dL (ref 8–23)
CALCIUM: 12.2 mg/dL — AB (ref 8.9–10.3)
CHLORIDE: 109 mmol/L (ref 98–111)
CO2: 27 mmol/L (ref 22–32)
CREATININE: 0.6 mg/dL (ref 0.44–1.00)
GFR calc non Af Amer: >60 mL/min (ref >60)
Glucose, Bld: 101 mg/dL — ABNORMAL HIGH (ref 70–99)
Potassium: 3.1 mmol/L — ABNORMAL LOW (ref 3.5–5.1)
SODIUM: 141 mmol/L (ref 135–145)

## 2018-05-15 LAB — MRSA PCR SCREENING: MRSA by PCR: NEGATIVE

## 2018-05-15 LAB — PROCALCITONIN: Procalcitonin: <0.1 ng/mL

## 2018-05-15 LAB — MAGNESIUM: Magnesium: 1.6 mg/dL — ABNORMAL LOW (ref 1.7–2.4)

## 2018-05-15 MED ORDER — MEGESTROL ACETATE 400 MG/10ML PO SUSP
400.0000 mg | Freq: Every day | ORAL | Status: DC
  Administered 2018-05-15 – 2018-05-17 (×3): 400 mg via ORAL
  Filled 2018-05-15 (×3): qty 10

## 2018-05-15 MED ORDER — POTASSIUM CHLORIDE 20 MEQ PO PACK
40.0000 meq | PACK | Freq: Two times a day (BID) | ORAL | Status: DC
  Administered 2018-05-15 (×2): 40 meq via ORAL
  Filled 2018-05-15 (×2): qty 2

## 2018-05-15 MED ORDER — FLUCONAZOLE IN SODIUM CHLORIDE 200-0.9 MG/100ML-% IV SOLN
200.0000 mg | Freq: Every day | INTRAVENOUS | Status: DC
  Administered 2018-05-15: 200 mg via INTRAVENOUS
  Filled 2018-05-15: qty 100

## 2018-05-15 MED ORDER — CALCITONIN (SALMON) 200 UNIT/ML IJ SOLN
200.0000 [IU] | Freq: Once | INTRAMUSCULAR | Status: AC
  Administered 2018-05-15: 200 [IU] via INTRAMUSCULAR
  Filled 2018-05-15: qty 1

## 2018-05-15 MED ORDER — FLUCONAZOLE IN SODIUM CHLORIDE 200-0.9 MG/100ML-% IV SOLN
200.0000 mg | Freq: Every day | INTRAVENOUS | Status: DC
  Filled 2018-05-15 (×2): qty 100

## 2018-05-15 MED ORDER — SODIUM CHLORIDE 0.9 % IV SOLN
INTRAVENOUS | Status: DC | PRN
  Administered 2018-05-15: 250 mL via INTRAVENOUS

## 2018-05-15 MED ORDER — MAGNESIUM SULFATE 2 GM/50ML IV SOLN
2.0000 g | Freq: Once | INTRAVENOUS | Status: AC
  Administered 2018-05-15: 2 g via INTRAVENOUS
  Filled 2018-05-15: qty 50

## 2018-05-15 NOTE — Progress Notes (Signed)
PT Cancellation Note  Patient Details Name: [name] MRN: [id number] DOB: Oct 07, 1926   Cancelled Treatment:     Order received. Chart reviewed. PT attempted session this morning however upon arrival RN in room and states that it will likely be a while before she is completed working with pt and suggest PT come back at later time. PT will attempt session at later time as pt is medically appropriate.  Yolonda Kida, SPT   Latajah Thuman 05/15/2018, 12:20 PM

## 2018-05-15 NOTE — NC FL2 (Addendum)
[place] LEVEL OF CARE SCREENING TOOL     IDENTIFICATION  Patient Name: Christina Coffey Birthdate: 06/07/1927 Sex: female Admission Date (Current Location): 05/14/2018  [place] and [place] Number:  Engineering geologist and Address:  [facility], [address] [place], [place] [address]      Provider Number: [id number]  Attending Physician Name and Address:  Fritzi Mandes, MD  Relative Name and Phone Number:       Current Level of Care: Hospital Recommended Level of Care: [place] Prior Approval Number:    Date Approved/Denied:   PASRR Number:    Discharge Plan: SNF    Current Diagnoses: Patient Active Problem List   Diagnosis Date Noted  . Pneumonia 05/14/2018  . Bronchiectasis ([place]) 02/29/2016  . Spondylolisthesis at L4-L5 level 07/23/2014    Orientation RESPIRATION BLADDER Height & Weight     Self, Place  Normal Incontinent Weight: 108 lb 11 oz (49.3 kg) Height:  5\' 1"  (154.9 cm)  BEHAVIORAL SYMPTOMS/MOOD NEUROLOGICAL BOWEL NUTRITION STATUS  (none) (none) Incontinent    AMBULATORY STATUS COMMUNICATION OF NEEDS Skin   Limited Assist Verbally Normal                       Personal Care Assistance Level of Assistance  Bathing, Dressing Bathing Assistance: Limited assistance   Dressing Assistance: Limited assistance     Functional Limitations Info  (none)          SPECIAL CARE FACTORS FREQUENCY  PT (By licensed PT)                    Contractures Contractures Info: Not present    Additional Factors Info  Code Status Code Status Info: full             Current Medications (05/15/2018):  This is the current hospital active medication list Current Facility-Administered Medications  Medication Dose Route Frequency Provider Last Rate Last Dose  . 0.9 %  sodium chloride infusion   Intravenous PRN Fritzi Mandes, MD   Stopped at 05/15/18 1153  . acetaminophen (TYLENOL) tablet 650 mg  650  mg Oral Q6H PRN Loletha Grayer, MD       Or  . acetaminophen (TYLENOL) suppository 650 mg  650 mg Rectal Q6H PRN Wieting, Richard, MD      . amLODipine (NORVASC) tablet 5 mg  5 mg Oral Daily Loletha Grayer, MD   5 mg at 05/15/18 1047  . azithromycin (ZITHROMAX) 500 mg in sodium chloride 0.9 % 250 mL IVPB  500 mg Intravenous Q24H Loletha Grayer, MD   Stopped at 05/14/18 2249  . budesonide (PULMICORT) nebulizer solution 0.5 mg  0.5 mg Nebulization BID Loletha Grayer, MD   0.5 mg at 05/15/18 0727  . calcitonin (MIACALCIN) injection 200 Units  200 Units Intramuscular Once Fritzi Mandes, MD      . ceFEPIme (MAXIPIME) 2 g in sodium chloride 0.9 % 100 mL IVPB  2 g Intravenous Q12H Loletha Grayer, MD   Stopped at 05/15/18 1116  . fluconazole (DIFLUCAN) IVPB 200 mg  200 mg Intravenous q1800 Fritzi Mandes, MD      . insulin aspart (novoLOG) injection 0-5 Units  0-5 Units Subcutaneous QHS Wieting, Richard, MD      . insulin aspart (novoLOG) injection 0-9 Units  0-9 Units Subcutaneous TID WC Wieting, Richard, MD      . ipratropium-albuterol (DUONEB) 0.5-2.5 (3) MG/3ML nebulizer solution 3 mL  3  mL Nebulization Q6H Loletha Grayer, MD   3 mL at 05/15/18 1318  . lactated ringers 1,000 mL with potassium chloride 20 mEq infusion   Intravenous Continuous Loletha Grayer, MD 75 mL/hr at 05/15/18 1200    . losartan (COZAAR) tablet 100 mg  100 mg Oral Daily Loletha Grayer, MD   100 mg at 05/15/18 1047  . magnesium sulfate IVPB 2 g 50 mL  2 g Intravenous Once Fritzi Mandes, MD      . megestrol (MEGACE) 400 MG/10ML suspension 400 mg  400 mg Oral Daily Fritzi Mandes, MD      . methylPREDNISolone sodium succinate (SOLU-MEDROL) 40 mg/mL injection 40 mg  40 mg Intravenous Daily Loletha Grayer, MD   40 mg at 05/15/18 1039  . pantoprazole (PROTONIX) EC tablet 40 mg  40 mg Oral Daily Loletha Grayer, MD   40 mg at 05/15/18 1047  . potassium chloride (KLOR-CON) packet 40 mEq  40 mEq Oral BID Fritzi Mandes, MD   40 mEq at  05/15/18 1113     Discharge Medications: Please see discharge summary for a list of discharge medications.  Relevant Imaging Results:  Relevant Lab Results:   Additional Information ss: [id number]  Shela Leff, LCSW

## 2018-05-15 NOTE — Progress Notes (Signed)
Patient was asleep. Chaplain referred OR for AD to on-call chaplain.

## 2018-05-15 NOTE — Consult Note (Signed)
Date of Admission:  05/14/2018                 Reason for Consult: b/l lung infitrates   Referring Provider: patel    HPI: Christina Coffey is a 84 y.o. female with history of hypertension,lung infiltrate lesions from few years, presents to the hospital brought in by the family because of profound weakness and fatigue and confusion over the past 2 to 3 days.  There is no history from the patient.  The daughter and daughter-in-law gives the history.  And chart reviewed.  Patient usually lives on her own with caretakers visiting her.  She is fairly mobile and independent.  Patient since the past 3 years is known to have lesions in her lung.  She  is followed by pulmonologist who had recommended procedure for diagnoses  but patient had opted not to choose an aggressive path.Since January she has been deteriorating with  fatigue, weight loss, poor appetite.  She had the flu and worsening pneumonia during that time and has not recovered significantly.  Her PCP recently started her on Megace.    In July she had a CT scan of the chest which showed worsening bilateral infiltrates with various cavities, consolidation,  decreased lung volume.  She has been on multiple courses of antibiotics with not much of an improvement.  She has been constipated.  And has had some episodes of nausea in the past few days In the ED she was found to have a temperature of 97.7 blood pressure of 159/90 heart rate of 88 and a respiratory rate of 15 on 05/14/2018.  She was confused.  She had a blood glucose of 167, potassium of 2.6 and a calcium of 12.7 creatinine of 0.75.  WBC was 12.5 Hb of 16.5 and platelet of 267 blood cultures were sent chest x-ray showed extensive bilateral airspace disease.  It question of multifocal pneumonia versus carcinoma.  She was started on IV fluids and also on IV antibiotics.  I am asked to see the patient for the pneumonia.  Her family is at her bedside.  They say she is better after getting fluids in the  hospital.  Patient is still confused and unable to give a good history.  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   Past Medical History:  Diagnosis Date  . Arthritis   . GERD (gastroesophageal reflux disease)   . Hypertension   . Pneumonia     Past Surgical History:  Procedure Laterality Date  . ABDOMINAL HYSTERECTOMY    . ANKLE FRACTURE SURGERY     with titanium plates & screws  right  . BACK SURGERY    . FOOT SURGERY     she thinks both....mainly bunions    Social History   Tobacco Use  . Smoking status: Former Smoker    Packs/day: 1.00    Years: 38.00    Pack years: 38.00  . Smokeless tobacco: Former Systems developer    Quit date: 07/16/1982  Substance Use Topics  . Alcohol use: Yes    Alcohol/week: 5.0 standard drinks    Types: 5 Glasses of wine per week    Comment: wine  . Drug use: No    Family History  Problem Relation Age of Onset  . CVA Mother   . Colon cancer Mother   . Heart failure Father   . Breast cancer Neg Hx      .  amLODipine  5 mg Oral Daily  . budesonide (PULMICORT) nebulizer solution  0.5 mg Nebulization BID  . insulin aspart  0-5 Units Subcutaneous QHS  . insulin aspart  0-9 Units Subcutaneous TID WC  . ipratropium-albuterol  3 mL Nebulization Q6H  . losartan  100 mg Oral Daily  . megestrol  400 mg Oral Daily  . methylPREDNISolone (SOLU-MEDROL) injection  40 mg Intravenous Daily  . pantoprazole  40 mg Oral Daily  . potassium chloride  40 mEq Oral BID      Abtx:  Anti-infectives (From admission, onward)   Start     Dose/Rate Route Frequency Ordered Stop   05/15/18 2000  vancomycin (VANCOCIN) IVPB 750 mg/150 ml premix  Status:  Discontinued     750 mg 150 mL/hr over 60 Minutes Intravenous Every 24 hours 05/14/18 2221 05/15/18 1352   05/15/18 1500  fluconazole (DIFLUCAN) IVPB 200 mg     200 mg 100 mL/hr over 60 Minutes Intravenous Daily-1800 05/15/18 1352      05/15/18 0900  ceFEPIme (MAXIPIME) 2 g in sodium chloride 0.9 % 100 mL IVPB     2 g 200 mL/hr over 30 Minutes Intravenous Every 12 hours 05/14/18 2221     05/14/18 2000  vancomycin (VANCOCIN) IVPB 1000 mg/200 mL premix     1,000 mg 200 mL/hr over 60 Minutes Intravenous  Once 05/14/18 1955 05/14/18 2245   05/14/18 2000  ceFEPIme (MAXIPIME) 2 g in sodium chloride 0.9 % 100 mL IVPB     2 g 200 mL/hr over 30 Minutes Intravenous  Once 05/14/18 1955 05/14/18 2140   05/14/18 2000  azithromycin (ZITHROMAX) 500 mg in sodium chloride 0.9 % 250 mL IVPB     500 mg 250 mL/hr over 60 Minutes Intravenous Every 24 hours 05/14/18 1955         Review of Systems: Not available because of her confusion  Allergies  Allergen Reactions  . Ace Inhibitors Cough  . Other Other (See Comments)    SHAKING/TREMORS  . Ciprofloxacin Palpitations  . Macrodantin [Nitrofurantoin Macrocrystal] Palpitations  . Novocain [Procaine] Palpitations  . Sulfa Antibiotics Palpitations    OBJECTIVE: Blood pressure (!) 172/86, pulse 70, temperature (!) 97.4 F (36.3 C), resp. rate 18, height 5\' 1"  (1.549 m), weight 49.3 kg, SpO2 96 %.  Physical Exam Constitutional: Thin, in no distress, awake, not oriented in place.  Recognized her daughter Eyes: Full range of movement ENT: No maxillary tenderness Oral cavity: Tongue coated CVS: S1-S2 no obvious murmur RS: Bilateral air entry crypts and rhonchi bilaterally GI: Soft no tenderness GU: Not examined MSK: Moves all her limbs no edema SKIN: Friable Neurologic: Confused not oriented in place or time, nonfocal examination Psychiatric: Confusion but no aggression or agitation Lymphatic: Left cervical 0.5 cm lymph node, left axillary 1 cm lymph node  Lab Results CBC    Component Value Date/Time   WBC 10.2 05/15/2018 0450   RBC 4.48 05/15/2018 0450   HGB 14.7 05/15/2018 0450   HGB 13.2 05/14/2014 0516   HCT 41.6 05/15/2018 0450   HCT 39.7 05/14/2014 0516   PLT 213  05/15/2018 0450   PLT 186 05/14/2014 0516   MCV 93.0 05/15/2018 0450   MCV 97 05/14/2014 0516   MCH 32.8 05/15/2018 0450   MCHC 35.3 05/15/2018 0450   RDW 14.1 05/15/2018 0450   RDW 12.6 05/14/2014 0516   LYMPHSABS 2.2 05/14/2014 0516   MONOABS 0.3 05/14/2014 0516   EOSABS 0.2 05/14/2014 0516   BASOSABS  0.1 05/14/2014 0516    CMP Latest Ref Rng & Units 05/15/2018 05/14/2018 04/11/2018  Glucose 70 - 99 mg/dL 101(H) 167(H) 147(H)  BUN 8 - 23 mg/dL 11 14 12   Creatinine 0.44 - 1.00 mg/dL 0.60 0.75 0.63  Sodium 135 - 145 mmol/L 141 138 139  Potassium 3.5 - 5.1 mmol/L 3.1(L) 2.6(LL) 2.8(L)  Chloride 98 - 111 mmol/L 109 103 101  CO2 22 - 32 mmol/L 27 29 30   Calcium 8.9 - 10.3 mg/dL 12.2(H) 12.7(H) 9.8  Total Protein 6.5 - 8.1 g/dL - - 6.0(L)  Total Bilirubin 0.3 - 1.2 mg/dL - - 1.2  Alkaline Phos 38 - 126 U/L - - 58  AST 15 - 41 U/L - - 23  ALT 0 - 44 U/L - - 14      Microbiology: Recent Results (from the past 240 hour(s))  Blood Culture (routine x 2)     Status: None (Preliminary result)   Collection Time: 05/14/18  8:22 PM  Result Value Ref Range Status   Specimen Description BLOOD LEFT AC  Final   Special Requests   Final    BOTTLES DRAWN AEROBIC AND ANAEROBIC Blood Culture adequate volume   Culture   Final    NO GROWTH < 12 HOURS Performed at [facility], [address], [place], [place] [address]    Report Status PENDING  Incomplete  Blood Culture (routine x 2)     Status: None (Preliminary result)   Collection Time: 05/14/18  8:53 PM  Result Value Ref Range Status   Specimen Description BLOOD RIGHT ANTECUBITAL  Final   Special Requests   Final    BOTTLES DRAWN AEROBIC AND ANAEROBIC Blood Culture adequate volume   Culture   Final    NO GROWTH < 12 HOURS Performed at [facility], [address] [place], [place] [address]    Report Status PENDING  Incomplete  MRSA PCR Screening     Status: None   Collection Time: 05/14/18 11:10 PM  Result  Value Ref Range Status   MRSA by PCR NEGATIVE NEGATIVE Final    Comment:        The GeneXpert MRSA Assay (FDA approved for NASAL specimens only), is one component of a comprehensive MRSA colonization surveillance program. It is not intended to diagnose MRSA infection nor to guide or monitor treatment for MRSA infections. Performed at [facility], [address] [place], [place] [address]       Radiographs and labs were personally reviewed by me.  CT shows bilateral infiltrates in the lungs with small cavities, consolidation and groundglass appearance with lung volume loss.  Left worse than the right.  This CT scan was taken on April 11 2018.  There is a clear progression compared to the CAT scan done 3 years ago Assessment and Plan 84 y.o. female with history of hypertension,lung infiltrate lesions from few years, presents to the hospital brought in by the family because of profound weakness and fatigue and confusion over the past 2 to 3 days.  There is no history from the patient.  The daughter and daughter-in-law gives the history.  And chart reviewed.  Patient usually lives on her own with caretakers visiting her.  She is fairly mobile and independent.  Patient since the past 3 years is known to have lesions in her lung.  She  is followed by pulmonologist who had recommended procedure for diagnoses  but patient had opted not to choose an aggressive path.Since  January she has been deteriorating with  fatigue, weight loss, poor appetite.  She had the flu and worsening pneumonia during that time and has not recovered significantly.  Her PCP recently started her on Megace.    In July she had a CT scan of the chest which showed worsening bilateral infiltrates with various cavities, consolidation,  decreased lung volume.  She has been on multiple courses of antibiotics with not much of an improvement.  She has been constipated.  And has had some episodes of nausea in the past few  days   Bilateral infiltrates of the lungs which is chronic and progressing over the past 3 years.  The differential diagnosis is extensive .From infectious disease perspective atypical mycobacteria is #1 on the list .Fungal infection is possible .  This is not Candida albicans.  Currently she is on cefepime, azithromycin and fluconazole has been added today.  Doubt this is bacterial pneumonia.  Recommend stopping fluconazole and azithromycin and cefepime.  May give augmentin. Sputum cultures will have to be sent for bacteria and AFB.  (Not for TB but to see whether that she has atypical mycobacteria so airborne isolation is not really needed).  she ideally needs a bronchoscopy and transbronchial biopsy for diagnosis and management.  Confusion, fatigue and weakness could be due to hypercalcemia , dehydration.  She is getting IV fluids.   because of hypercalcemia carcinoma is seriously being considered as a diagnosis and sarcoid are in the differential.  Hypokalemia being replaced  Severe malnutrition  Hyperglycemia on insulin as needed  Hypertension on Norvasc and losartan  Discussed with her daughter and daughter-in-law in great detail regarding the condition and the need for bronchoscopy if aggressive management is the goal. Discussed with  her pulmonologist Dr.Kasa  Tsosie Billing, MD  05/15/2018, 2:17 PM

## 2018-05-15 NOTE — Progress Notes (Signed)
MEDICATION RELATED CONSULT NOTE - INITIAL   Pharmacy Consult for electrolyte management Indication: hypokalemia, hypomagnesemia, hypercalcemia  Allergies  Allergen Reactions  . Ace Inhibitors Cough  . Other Other (See Comments)    SHAKING/TREMORS  . Ciprofloxacin Palpitations  . Macrodantin [Nitrofurantoin Macrocrystal] Palpitations  . Novocain [Procaine] Palpitations  . Sulfa Antibiotics Palpitations    Patient Measurements: Height: 5\' 1"  (154.9 cm) Weight: 108 lb 11 oz (49.3 kg) IBW/kg (Calculated) : 47.8  Vital Signs: Temp: 97.4 F (36.3 C) (08/21 1340) Temp Source: Oral (08/21 0522) BP: 172/86 (08/21 1400) Pulse Rate: 70 (08/21 1340) Intake/Output from previous day: 08/20 0701 - 08/21 0700 In: 500 [I.V.:500] Out: 200 [Urine:200] Intake/Output from this shift: Total I/O In: 881.6 [I.V.:531.2; IV Piggyback:350.3] Out: -   Labs: Recent Labs    05/14/18 1834 05/15/18 0450  WBC 12.5* 10.2  HGB 16.5* 14.7  HCT 47.1* 41.6  PLT 267 213  CREATININE 0.75 0.60  MG  --  1.6*   Estimated Creatinine Clearance: 36 mL/min (by C-G formula based on SCr of 0.6 mg/dL).   Microbiology: Recent Results (from the past 720 hour(s))  Blood Culture (routine x 2)     Status: None (Preliminary result)   Collection Time: 05/14/18  8:22 PM  Result Value Ref Range Status   Specimen Description BLOOD LEFT AC  Final   Special Requests   Final    BOTTLES DRAWN AEROBIC AND ANAEROBIC Blood Culture adequate volume   Culture   Final    NO GROWTH < 12 HOURS Performed at [facility], [address] [place], [place] [address]    Report Status PENDING  Incomplete  Blood Culture (routine x 2)     Status: None (Preliminary result)   Collection Time: 05/14/18  8:53 PM  Result Value Ref Range Status   Specimen Description BLOOD RIGHT ANTECUBITAL  Final   Special Requests   Final    BOTTLES DRAWN AEROBIC AND ANAEROBIC Blood Culture adequate volume   Culture   Final    NO GROWTH  < 12 HOURS Performed at [facility], [address], [place], [place] [address]    Report Status PENDING  Incomplete  MRSA PCR Screening     Status: None   Collection Time: 05/14/18 11:10 PM  Result Value Ref Range Status   MRSA by PCR NEGATIVE NEGATIVE Final    Comment:        The GeneXpert MRSA Assay (FDA approved for NASAL specimens only), is one component of a comprehensive MRSA colonization surveillance program. It is not intended to diagnose MRSA infection nor to guide or monitor treatment for MRSA infections. Performed at [facility], [address] [place], [place] [address]     Medical History: Past Medical History:  Diagnosis Date  . Arthritis   . GERD (gastroesophageal reflux disease)   . Hypertension   . Pneumonia     Assessment: 84 year-old female with multiple forms of electrolyte abnormalities: specifically hypokalemia, hypomagnesemia, hypercalcemia. Prior to these labs this morning there was no replacement therapy administered. Of note, there was 2 grams of IV magnesium sulfate prescribed but not administered.  Goal of Therapy:  K 3.5-5.1 mmol/L Magnesium: 1.7-2.4 mg/dL Ca: 8.9-10.3 mg/dL  Plan:  1) KCl 54mEq po bid 2) Magnesium sulfate 2 grams IV once 3) calcitonin 4 units/kg IM once  Follow-up labs in the morning  Dallie Piles, PharmD 05/15/2018,2:10 PM

## 2018-05-15 NOTE — Care Management Note (Signed)
Case Management Note  Patient Details  Name: Christina Coffey MRN: [id number] Date of Birth: [birth year]  Subjective/Objective:                 Presents to the ED with profound weakness.  She has been treated on and off for pneumonia since having the flu in March. Seen by pulmonology as outpatient in July 2019.Marland Kitchen Patient had lingering opacity in left lower lobe and at that time patient indicated if found to be malignant, would not pursure any treatment. Patient declined bronchoscopy in July.  Was independent in all adls, no issues accessing medical care, obtaining medications or with transportation.  Current with her PCP.  PT consult is pending.  She is currently on room air. Pulmonary consult is pending  Action/Plan:   Expected Discharge Date:  05/17/18               Expected Discharge Plan:     In-House Referral:     Discharge planning Services     Post Acute Care Choice:    Choice offered to:     DME Arranged:    DME Agency:     HH Arranged:    HH Agency:     Status of Service:     If discussed at [employer], dates discussed:    Additional Comments:  Katrina Stack, RN 05/15/2018, 11:12 AM

## 2018-05-15 NOTE — Progress Notes (Signed)
Christina Coffey NAME: Christina Coffey    MR#:  [id number]  DATE OF BIRTH:  [birth year]  SUBJECTIVE:   Confused, weak, poor appetite and weight loss Son in the room Pt not much talking REVIEW OF SYSTEMS:   Review of Systems  Unable to perform ROS: Acuity of condition   Tolerating Diet:no Tolerating PT: pending  DRUG ALLERGIES:   Allergies  Allergen Reactions  . Ace Inhibitors Cough  . Other Other (See Comments)    SHAKING/TREMORS  . Ciprofloxacin Palpitations  . Macrodantin [Nitrofurantoin Macrocrystal] Palpitations  . Novocain [Procaine] Palpitations  . Sulfa Antibiotics Palpitations    VITALS:  Blood pressure (!) 172/86, pulse 70, temperature (!) 97.4 F (36.3 C), resp. rate 18, height 5\' 1"  (1.549 m), weight 49.3 kg, SpO2 96 %.  PHYSICAL EXAMINATION:   Physical Exam  GENERAL:  84 y.o.-year-old patient lying in the bed with no acute distress. Thin, cachecticEYES: Pupils equal, round, reactive to light and accommodation. No scleral icterus. Extraocular muscles intact.  HEENT: Head atraumatic, normocephalic. Oropharynx and nasopharynx clear.  NECK:  Supple, no jugular venous distention. No thyroid enlargement, no tenderness.  LUNGS: distant breath sounds bilaterally, no wheezing, rales, rhonchi. No use of accessory muscles of respiration.  CARDIOVASCULAR: S1, S2 normal. No murmurs, rubs, or gallops.  ABDOMEN: Soft, nontender, nondistended. Bowel sounds present. No organomegaly or mass.  EXTREMITIES: No cyanosis, clubbing or edema b/l.    NEUROLOGIC: Cranial nerves II through XII are intact. No focal Motor or sensory deficits b/l.  Globally weak PSYCHIATRIC:  patient is alert but not communicating much SKIN: No obvious rash, lesion, or ulcer.   LABORATORY PANEL:  CBC Recent Labs  Lab 05/15/18 0450  WBC 10.2  HGB 14.7  HCT 41.6  PLT 213    Chemistries  Recent Labs  Lab 05/15/18 0450  NA 141  K 3.1*  CL  109  CO2 27  GLUCOSE 101*  BUN 11  CREATININE 0.60  CALCIUM 12.2*  MG 1.6*   Cardiac Enzymes No results for input(s): TROPONINI in the last 168 hours. RADIOLOGY:  Dg Chest 2 View  Result Date: 05/14/2018 CLINICAL DATA:  Multiple lung masses. EXAM: CHEST - 2 VIEW COMPARISON:  CT chest 04/11/2018 and 12/19/2017. FINDINGS: Extensive bilateral airspace opacities are worse on the left and persist. Aeration in the left lower lobe appears worse than on the prior examination. Cardiac silhouette is obscured. No pneumothorax or pleural fluid. Aortic atherosclerosis is noted. IMPRESSION: Extensive bilateral airspace disease is worse on the left where it has progressed since the most recent examination. Findings could be due to multifocal pneumonia and/or carcinoma. Electronically Signed   By: Inge Rise M.D.   On: 05/14/2018 19:47   Mr Jeri Cos UX Contrast  Result Date: 05/15/2018 CLINICAL DATA:  Confusion, fatigue. Recurrent pneumonia. History of hypertension. EXAM: MRI HEAD WITHOUT AND WITH CONTRAST TECHNIQUE: Multiplanar, multiecho pulse sequences of the brain and surrounding structures were obtained without and with intravenous contrast. CONTRAST:  25mL MULTIHANCE GADOBENATE DIMEGLUMINE 529 MG/ML IV SOLN COMPARISON:  CT HEAD October 17, 2016 FINDINGS: Multiple sequences are mildly or moderately motion degraded. INTRACRANIAL CONTENTS: No reduced diffusion to suggest acute ischemia. No susceptibility artifact to suggest hemorrhage. The ventricles and sulci are normal for patient's age. Patchy to confluent supratentorial white matter FLAIR T2 hyperintensities. Old RIGHT basal ganglia and RIGHT thalamus lacunar infarcts. Prominent basal ganglia and thalami perivascular spaces associated with chronic small vessel ischemic changes.  No suspicious parenchymal signal, masses, mass effect. No abnormal extra-axial fluid collections. No extra-axial masses. No abnormal intraparenchymal or extra-axial enhancement.  VASCULAR: Normal major intracranial vascular flow voids present at skull base. SKULL AND UPPER CERVICAL SPINE: No abnormal sellar expansion. No suspicious calvarial bone marrow signal. Craniocervical junction maintained. SINUSES/ORBITS: The mastoid air-cells and included paranasal sinuses are well-aerated.The included ocular globes and orbital contents are non-suspicious. Status post bilateral ocular lens implants. OTHER: None. IMPRESSION: 1. No acute intracranial process on this motion degraded examination. 2. Moderate to severe chronic small vessel ischemic changes and old lacunar infarcts. Electronically Signed   By: Elon Alas M.D.   On: 05/15/2018 00:38   ASSESSMENT AND PLAN:  Christina Coffey  is a 84 y.o. female with a known history of recurrent pneumonia that has not gotten better.  Started with having the flu and pneumonia back in March.  She had a course of antibiotics at that time and another course afterwards.  She saw Dr. Mortimer Fries last month and prescribed 30 days of Augmentin.   1. Persistent Infiltirate right and left lungs with weight loss, poor appetite, hypercalcemia and long standing h/o smoking worrisome for Lung malignancy -seen by Dr Ram--cont IV abxs added antifungal -son understands poor prognosis and currently pulm recommends no furthter w/u since high risk for procedures -wbc normal, no fever, procalcitonin x2 negative suggestive more of malignancy   2.  Severe hypercalcemia.  IV fluid hydration.  likely due to cancerous process.  Send off a PTH and PTH related protein.  The patient does look dehydrated with hemoconcentration and a slightly elevated creatinine from her baseline.  3.  Hypokalemia.  Replace potassium orally and an IV fluids.  4.  Acute encephalopathy.  MRI of the brain with contrast --negative  5.  severe Malnutrition -dietitian to see  6.  Hypertension on Norvasc and losartan.  Consider Pallitaive care down the road  Case discussed with Care  Management/Social Worker. Management plans discussed with the patient, family and they are in agreement.  CODE STATUS: FULL  DVT Prophylaxis: lovenox  TOTAL TIME TAKING CARE OF THIS PATIENT: *30* minutes.  >50% time spent on counselling and coordination of care  POSSIBLE D/C IN *few DAYS, DEPENDING ON CLINICAL CONDITION.  Note: This dictation was prepared with Dragon dictation along with smaller phrase technology. Any transcriptional errors that result from this process are unintentional.  Fritzi Mandes M.D on 05/15/2018 at 6:01 PM  Between 7am to 6pm - Pager - [phone]  After 6pm go to [email or web address] - password [email or web address] [facility]  Office  [phone]  CC: Primary care physician; Idelle Crouch, MDPatient ID: Christina Coffey, female   DOB: 02/13/1927, 84 y.o.   MRN: [id number]

## 2018-05-15 NOTE — Progress Notes (Signed)
PT Cancellation Note  Patient Details Name: MARISUE CANION MRN: [id number] DOB: 31-Jan-1927   Cancelled Treatment:    Reason Eval/Treat Not Completed: Fatigue/lethargy limiting ability to participate.  Pt sleeping upon PT arrival and pt appears confused and very lethargic.  Family requested for PT to attempt evaluation at a different time.  Will attempt to see pt again next date.    Collie Siad PT, DPT 05/15/2018, 3:53 PM

## 2018-05-15 NOTE — Consult Note (Signed)
[facility]      Assessment and Plan:  Multifocal, multilobar opacities involving both lungs.  Suspect metastatic cancer, most probably lung origin. Hypercalcemia, suspect related to underlying malignancy, though other causes (such as sarcoid could contribute).  Weakness, fatigue, dehydration-- may be related to hypercalcemia.   --Discussed with son, understand likelihood of cancer, patient does not want treatment of cancer, but they would like to treat any other conditions are "not ready to give up". I explained that bronchoscopy would not be an option at this time due to her risk of complication.  --Continue RX with broad spectrum, will add anti-fungal. Will check ACE level with AM labs.  --Will check induced sputum and send for cytology and KOH for fungus.  --Agree with empiric steroids, would maintain on prednisone 20 mg daily at DC.    Date: 05/15/2018  MRN# [id number] Christina [facility] Apr 21, 1927  Referring Physician: Dr. Posey Pronto for lung infiltrates.   Christina [facility] is a 84 y.o. old female seen in [facility] for chief complaint of:    Chief Complaint  Patient presents with  . Weakness    HPI:   The patient is an 84 year old female, she is been seen in our office by Dr. Mortimer Fries in the past.  She has been noted to have a chronic cough with a chronic left lower lobe opacity which has been slowly progressively and slowly increasing in size over several years.  She apparently had undergone bronchoscopy in October 2015, no malignancy was found.  More recently she saw Dr. Mortimer Fries on 04/22/2018 there is a was noted the patient had been experiencing productive cough and progressive shortness of breath, she had been treated with 3 rounds of antibiotics and steroids.  His most recent CT chest showed multifocal opacities affecting bilateral upper lobes, and right lower lobe area of cavitation.  At that time Dr. Mortimer Fries again  the possibility of bronchoscopy, however after  discussing risks and benefits the family opted not to proceed. She was treated with antibiotics, and started on Advair, flutter valve with incentive spirometry.  Patient presented to the ED yesterday on 05/14/2018 with progressive shortness of breath, and weakness which is been rapidly progressive over the past 1 week.  On initial evaluation was found that she has significant hypercalcemia with calcium level of 12.7, creatinine increased from a baseline of 0.63 to 0.75.  Procalcitonin is less than 0.1.  WBC count is mildly elevated at 12.5 Currently the patient is being maintained on room air with oxygen saturation 96%.  Chest x-ray taken on 05/14/2018, imaging personally reviewed, the opacity previously involving the left lower lobe is now involving much of the left lower lobe with left lower lobe atelectasis, there is also extensive involvement of the right upper lobe with partial atelectasis of the right upper lobe, as well as significant involvement in the right middle lobe.  PMHX:   Past Medical History:  Diagnosis Date  . Arthritis   . GERD (gastroesophageal reflux disease)   . Hypertension   . Pneumonia    Surgical Hx:  Past Surgical History:  Procedure Laterality Date  . ABDOMINAL HYSTERECTOMY    . ANKLE FRACTURE SURGERY     with titanium plates & screws  right  . BACK SURGERY    . FOOT SURGERY     she thinks both....mainly bunions   Family Hx:  Family History  Problem Relation Age of Onset  . CVA Mother   . Colon cancer Mother   .  Heart failure Father   . Breast cancer Neg Hx    Social Hx:   Social History   Tobacco Use  . Smoking status: Former Smoker    Packs/day: 1.00    Years: 38.00    Pack years: 38.00  . Smokeless tobacco: Former Systems developer    Quit date: 07/16/1982  Substance Use Topics  . Alcohol use: Yes    Alcohol/week: 5.0 standard drinks    Types: 5 Glasses of wine per week    Comment: wine  . Drug use: No   Medication:    Current Facility-Administered  Medications:  .  0.9 %  sodium chloride infusion, , Intravenous, PRN, Fritzi Mandes, MD, Stopped at 05/15/18 1153 .  acetaminophen (TYLENOL) tablet 650 mg, 650 mg, Oral, Q6H PRN **OR** acetaminophen (TYLENOL) suppository 650 mg, 650 mg, Rectal, Q6H PRN, Wieting, Richard, MD .  amLODipine (NORVASC) tablet 5 mg, 5 mg, Oral, Daily, Wieting, Richard, MD, 5 mg at 05/15/18 1047 .  amLODipine (NORVASC) tablet 5 mg, 5 mg, Oral, STAT, Wieting, Richard, MD .  azithromycin (ZITHROMAX) 500 mg in sodium chloride 0.9 % 250 mL IVPB, 500 mg, Intravenous, Q24H, Loletha Grayer, MD, Stopped at 05/14/18 2249 .  budesonide (PULMICORT) nebulizer solution 0.5 mg, 0.5 mg, Nebulization, BID, Leslye Peer, Richard, MD, 0.5 mg at 05/15/18 0727 .  ceFEPIme (MAXIPIME) 2 g in sodium chloride 0.9 % 100 mL IVPB, 2 g, Intravenous, Q12H, Loletha Grayer, MD, Stopped at 05/15/18 1116 .  insulin aspart (novoLOG) injection 0-5 Units, 0-5 Units, Subcutaneous, QHS, Wieting, Richard, MD .  insulin aspart (novoLOG) injection 0-9 Units, 0-9 Units, Subcutaneous, TID WC, Wieting, Richard, MD .  ipratropium-albuterol (DUONEB) 0.5-2.5 (3) MG/3ML nebulizer solution 3 mL, 3 mL, Nebulization, Q6H, Wieting, Richard, MD, 3 mL at 05/15/18 0727 .  lactated ringers 1,000 mL with potassium chloride 20 mEq infusion, , Intravenous, Continuous, Wieting, Richard, MD, Last Rate: 75 mL/hr at 05/15/18 1200 .  losartan (COZAAR) tablet 100 mg, 100 mg, Oral, Daily, Leslye Peer, Richard, MD, 100 mg at 05/15/18 1047 .  magnesium sulfate IVPB 2 g 50 mL, 2 g, Intravenous, Once, Wieting, Richard, MD .  methylPREDNISolone sodium succinate (SOLU-MEDROL) 40 mg/mL injection 40 mg, 40 mg, Intravenous, Daily, Leslye Peer, Richard, MD, 40 mg at 05/15/18 1039 .  pantoprazole (PROTONIX) EC tablet 40 mg, 40 mg, Oral, Daily, Leslye Peer, Richard, MD, 40 mg at 05/15/18 1047 .  potassium chloride (KLOR-CON) packet 40 mEq, 40 mEq, Oral, BID, Fritzi Mandes, MD, 40 mEq at 05/15/18 1113 .  vancomycin  (VANCOCIN) IVPB 750 mg/150 ml premix, 750 mg, Intravenous, Q24H, Wieting, Richard, MD   Allergies:  Ace inhibitors; Other; Ciprofloxacin; Macrodantin [nitrofurantoin macrocrystal]; Novocain [procaine]; and Sulfa antibiotics  Review of Systems: Could not be obtained due to illness, reduced mental status.   Physical Examination:   VS: BP (!) 131/51 (BP Location: Right Arm)   Pulse 69   Temp 98.3 F (36.8 C) (Oral)   Resp 18   Ht 5\' 1"  (1.549 m)   Wt 49.3 kg   SpO2 96%   BMI 20.54 kg/m   General Appearance: No distress, sleepy. Neuro:without focal findings,  speech normal,  HEENT: PERRLA, EOM intact.   Pulmonary: bilateral rhonchi.  CardiovascularNormal S1,S2.  No m/r/g.   Abdomen: Benign, Soft, non-tender. Renal:  No costovertebral tenderness  GU:  No performed at this time. Endoc: No evident thyromegaly, no signs of acromegaly. Skin:   warm, no rashes, no ecchymosis  Extremities: normal, no cyanosis, clubbing.  Other findings:  LABORATORY PANEL:   CBC Recent Labs  Lab 05/15/18 0450  WBC 10.2  HGB 14.7  HCT 41.6  PLT 213   ------------------------------------------------------------------------------------------------------------------  Chemistries  Recent Labs  Lab 05/15/18 0450  NA 141  K 3.1*  CL 109  CO2 27  GLUCOSE 101*  BUN 11  CREATININE 0.60  CALCIUM 12.2*  MG 1.6*   ------------------------------------------------------------------------------------------------------------------  Cardiac Enzymes No results for input(s): TROPONINI in the last 168 hours. ------------------------------------------------------------  RADIOLOGY:  Dg Chest 2 View  Result Date: 05/14/2018 CLINICAL DATA:  Multiple lung masses. EXAM: CHEST - 2 VIEW COMPARISON:  CT chest 04/11/2018 and 12/19/2017. FINDINGS: Extensive bilateral airspace opacities are worse on the left and persist. Aeration in the left lower lobe appears worse than on the prior examination. Cardiac  silhouette is obscured. No pneumothorax or pleural fluid. Aortic atherosclerosis is noted. IMPRESSION: Extensive bilateral airspace disease is worse on the left where it has progressed since the most recent examination. Findings could be due to multifocal pneumonia and/or carcinoma. Electronically Signed   By: Inge Rise M.D.   On: 05/14/2018 19:47   Mr Jeri Cos XI Contrast  Result Date: 05/15/2018 CLINICAL DATA:  Confusion, fatigue. Recurrent pneumonia. History of hypertension. EXAM: MRI HEAD WITHOUT AND WITH CONTRAST TECHNIQUE: Multiplanar, multiecho pulse sequences of the brain and surrounding structures were obtained without and with intravenous contrast. CONTRAST:  70mL MULTIHANCE GADOBENATE DIMEGLUMINE 529 MG/ML IV SOLN COMPARISON:  CT HEAD October 17, 2016 FINDINGS: Multiple sequences are mildly or moderately motion degraded. INTRACRANIAL CONTENTS: No reduced diffusion to suggest acute ischemia. No susceptibility artifact to suggest hemorrhage. The ventricles and sulci are normal for patient's age. Patchy to confluent supratentorial white matter FLAIR T2 hyperintensities. Old RIGHT basal ganglia and RIGHT thalamus lacunar infarcts. Prominent basal ganglia and thalami perivascular spaces associated with chronic small vessel ischemic changes. No suspicious parenchymal signal, masses, mass effect. No abnormal extra-axial fluid collections. No extra-axial masses. No abnormal intraparenchymal or extra-axial enhancement. VASCULAR: Normal major intracranial vascular flow voids present at skull base. SKULL AND UPPER CERVICAL SPINE: No abnormal sellar expansion. No suspicious calvarial bone marrow signal. Craniocervical junction maintained. SINUSES/ORBITS: The mastoid air-cells and included paranasal sinuses are well-aerated.The included ocular globes and orbital contents are non-suspicious. Status post bilateral ocular lens implants. OTHER: None. IMPRESSION: 1. No acute intracranial process on this motion  degraded examination. 2. Moderate to severe chronic small vessel ischemic changes and old lacunar infarcts. Electronically Signed   By: Elon Alas M.D.   On: 05/15/2018 00:38       Thank  you for the [facility] and for allowing [facility] to assist in the care of your patient. Our recommendations are noted above.  Please contact us if we can be of further service.   Marda Stalker, M.D., F.C.C.P.  Board Certified in Internal Medicine, Pulmonary Medicine, [place], and Sleep Medicine.  [facility] Office Number: [phone]   05/15/2018

## 2018-05-16 DIAGNOSIS — C3490 Malignant neoplasm of unspecified part of unspecified bronchus or lung: Secondary | ICD-10-CM

## 2018-05-16 LAB — COMPREHENSIVE METABOLIC PANEL
ALBUMIN: 3.3 g/dL — AB (ref 3.5–5.0)
ALT: 15 U/L (ref 0–44)
AST: 24 U/L (ref 15–41)
Alkaline Phosphatase: 39 U/L (ref 38–126)
Anion gap: 8 (ref 5–15)
BUN: 11 mg/dL (ref 8–23)
CHLORIDE: 109 mmol/L (ref 98–111)
CO2: 24 mmol/L (ref 22–32)
Calcium: 11.5 mg/dL — ABNORMAL HIGH (ref 8.9–10.3)
Creatinine, Ser: 0.63 mg/dL (ref 0.44–1.00)
GFR calc Af Amer: >60 mL/min (ref >60)
Glucose, Bld: 128 mg/dL — ABNORMAL HIGH (ref 70–99)
POTASSIUM: 3.6 mmol/L (ref 3.5–5.1)
Sodium: 141 mmol/L (ref 135–145)
Total Bilirubin: 1.1 mg/dL (ref 0.3–1.2)
Total Protein: 5.7 g/dL — ABNORMAL LOW (ref 6.5–8.1)

## 2018-05-16 LAB — GLUCOSE, CAPILLARY
GLUCOSE-CAPILLARY: 109 mg/dL — AB (ref 70–99)
GLUCOSE-CAPILLARY: 134 mg/dL — AB (ref 70–99)
GLUCOSE-CAPILLARY: 129 mg/dL — AB (ref 70–99)
Glucose-Capillary: 94 mg/dL (ref 70–99)

## 2018-05-16 LAB — PTH, INTACT AND CALCIUM
CALCIUM TOTAL (PTH): 12.7 mg/dL — AB (ref 8.7–10.3)
PTH: 10 pg/mL — ABNORMAL LOW (ref 15–65)

## 2018-05-16 LAB — PROCALCITONIN: Procalcitonin: <0.1 ng/mL

## 2018-05-16 LAB — MAGNESIUM: Magnesium: 1.8 mg/dL (ref 1.7–2.4)

## 2018-05-16 MED ORDER — AMOXICILLIN-POT CLAVULANATE 875-125 MG PO TABS
1.0000 | ORAL_TABLET | Freq: Two times a day (BID) | ORAL | Status: DC
  Administered 2018-05-17: 1 via ORAL
  Filled 2018-05-16 (×2): qty 1

## 2018-05-16 MED ORDER — PREDNISONE 20 MG PO TABS
20.0000 mg | ORAL_TABLET | Freq: Every day | ORAL | Status: DC
  Administered 2018-05-17: 20 mg via ORAL
  Filled 2018-05-16: qty 1

## 2018-05-16 MED ORDER — CALCITONIN (SALMON) 200 UNIT/ML IJ SOLN
200.0000 [IU] | Freq: Once | INTRAMUSCULAR | Status: AC
  Administered 2018-05-16: 200 [IU] via INTRAMUSCULAR
  Filled 2018-05-16: qty 1

## 2018-05-16 NOTE — Clinical Social Work Note (Signed)
Clinical Social Work Assessment  Patient Details  Name: Christina [facility] MRN: [id number] Date of Birth: [birth year]  Date of referral:  05/16/18               Reason for consult:  Discharge Planning                Permission sought to share information with:    Permission granted to share information::     Name::        Agency::     Relationship::     Contact Information:     Housing/Transportation Living arrangements for the past 2 months:  Single Family Home Source of Information:  Adult Children Patient Interpreter Needed:  None Criminal Activity/Legal Involvement Pertinent to Current Situation/Hospitalization:  No - Comment as needed Significant Relationships:  Adult Children Lives with:  Self Do you feel safe going back to the place where you live?    Need for family participation in patient care:  Yes (Comment)  Care giving concerns:  Patient resides at home.    Social Worker assessment / plan:  CSW met with patient's son at bedside: [name]: [phone]. CSW spoke with him regarding PT recommendation and he was pleased with that recommendation. Patient did not speak but was alert throughout assessment. CSW had been updated by MD yesterday that he was wanting rehab. CSW had initiated a bed search yesterday and extended bed offers today to the son. Patient's son was going to speak with is other siblings today and make a decision. CSW informed him that the sooner the better because insurance will have to authorize.  Employment status:    Insurance information:    PT Recommendations:  [place] / Referral to community resources:     Patient/Family's Response to care:  Patient's son expressed appreciation for CSW assistance.  Patient/Family's Understanding of and Emotional Response to Diagnosis, Current Treatment, and Prognosis:  Patient was rather stoic. Patient's son was pleasant and asked appropriate questions.  Emotional Assessment Appearance:   Appears stated age Attitude/Demeanor/Rapport:  (did not speak during assessment but was alert) Affect (typically observed):  Calm Orientation:    Alcohol / Substance use:  Not Applicable Psych involvement (Current and /or in the community):  No (Comment)  Discharge Needs  Concerns to be addressed:  Care Coordination Readmission within the last 30 days:  No Current discharge risk:  None Barriers to Discharge:  No Barriers Identified   Shela Leff, LCSW 05/16/2018, 2:54 PM

## 2018-05-16 NOTE — Progress Notes (Signed)
MEDICATION RELATED CONSULT NOTE - INITIAL   Pharmacy Consult for electrolyte management Indication: hypokalemia, hypomagnesemia, hypercalcemia  Allergies  Allergen Reactions  . Ace Inhibitors Cough  . Other Other (See Comments)    SHAKING/TREMORS  . Ciprofloxacin Palpitations  . Macrodantin [Nitrofurantoin Macrocrystal] Palpitations  . Novocain [Procaine] Palpitations  . Sulfa Antibiotics Palpitations    Patient Measurements: Height: 5\' 1"  (154.9 cm) Weight: 108 lb 11 oz (49.3 kg) IBW/kg (Calculated) : 47.8  Vital Signs: Temp: 97.4 F (36.3 C) (08/22 0403) Temp Source: Axillary (08/22 0403) BP: 156/62 (08/22 0403) Pulse Rate: 60 (08/22 0403) Intake/Output from previous day: 08/21 0701 - 08/22 0700 In: 2806.4 [P.O.:118; I.V.:1834.5; IV Piggyback:853.9] Out: 450 [Urine:450] Intake/Output from this shift: No intake/output data recorded.  Labs: Recent Labs    05/14/18 1834 05/15/18 0450 05/16/18 0432  WBC 12.5* 10.2  --   HGB 16.5* 14.7  --   HCT 47.1* 41.6  --   PLT 267 213  --   CREATININE 0.75 0.60 0.63  MG  --  1.6* 1.8  ALBUMIN  --   --  3.3*  PROT  --   --  5.7*  AST  --   --  24  ALT  --   --  15  ALKPHOS  --   --  39  BILITOT  --   --  1.1   Estimated Creatinine Clearance: 36 mL/min (by C-G formula based on SCr of 0.63 mg/dL).   Microbiology: Recent Results (from the past 720 hour(s))  Blood Culture (routine x 2)     Status: None (Preliminary result)   Collection Time: 05/14/18  8:22 PM  Result Value Ref Range Status   Specimen Description BLOOD LEFT [facility]  Final   Special Requests   Final    BOTTLES DRAWN AEROBIC AND ANAEROBIC Blood Culture adequate volume   Culture   Final    NO GROWTH 2 DAYS Performed at [facility], [address] [place], [place] [address]    Report Status PENDING  Incomplete  Blood Culture (routine x 2)     Status: None (Preliminary result)   Collection Time: 05/14/18  8:53 PM  Result Value Ref Range Status   Specimen Description BLOOD RIGHT ANTECUBITAL  Final   Special Requests   Final    BOTTLES DRAWN AEROBIC AND ANAEROBIC Blood Culture adequate volume   Culture   Final    NO GROWTH 2 DAYS Performed at [facility], [address], [place], [place] [address]    Report Status PENDING  Incomplete  MRSA PCR Screening     Status: None   Collection Time: 05/14/18 11:10 PM  Result Value Ref Range Status   MRSA by PCR NEGATIVE NEGATIVE Final    Comment:        The GeneXpert MRSA Assay (FDA approved for NASAL specimens only), is one component of a comprehensive MRSA colonization surveillance program. It is not intended to diagnose MRSA infection nor to guide or monitor treatment for MRSA infections. Performed at [facility], [address], [place], [place] [address]     Medical History: Past Medical History:  Diagnosis Date  . Arthritis   . GERD (gastroesophageal reflux disease)   . Hypertension   . Pneumonia     Assessment: 84 year-old female with multiple forms of electrolyte abnormalities: specifically hypokalemia, hypomagnesemia, hypercalcemia. On 8/21 she received the following:  1) KCl 72mEq po bid 2) Magnesium sulfate 2 grams IV once 3) calcitonin 4  units/kg IM once  Potassium and magnesium are now at goal. Follow-up corrected calcium equals 12.1 mg/dL, therefore supratherapeutic  Goal of Therapy:  K 3.5-5.1 mmol/L Magnesium: 1.7-2.4 mg/dL Ca: 8.9-10.3 mg/dL  Plan:  Repeat calcitonin 4 units/kg IM once No further potassium or magnesium replacement at this time  Follow-up labs in the morning  Dallie Piles, PharmD 05/16/2018,10:04 AM

## 2018-05-16 NOTE — Evaluation (Signed)
Physical Therapy Evaluation Patient Details Name: Christina [facility] MRN: [id number] DOB: Sep 26, 1926 Today's Date: 05/16/2018   History of Present Illness  Pt presents to hospital on 05/14/18 with general weakness and decline over the previous week. Pt subsequently diagnosed with pneumonia, hypercalcemia, and hypokalemia. One week prior to admission pt diagnosed with multiple masses on her lungs that were likely cancer. Pts other past medical history includes OA, GERD, HTN, and pneumonia. Stroke work up completed and reveals no abnormalities  Clinical Impression  Pt is a pleasant 84 year old female who was admitted for pneumonia, hypercalcemia, and hypokalemia. Pt performs bed mobility and transfers with mod assistance. Pt demonstrates deficits with strength, mobility, activity tolerance, and cognition. Pts vitals monitored throughout session and HR and O2 remained WNL. Pt transferred stand pivot from bed to chair beside bed with mod assistance. Pt is confused however pleasant and participates well with therapy doing better with short verbal cuing and large movements. Extensive discussion with pts family about role of PT going forward in pts plan of care. Pt could benefit from continued skilled therapy at this time to improve deficits toward PLOF. PT will continue to work with pt at least 2x/week while admitted. D/c recommendations at this time are SNF. Pts family states that they would like more information about pts prognosis and that they would likely only accept SNF placement if prognosis is good.      Follow Up Recommendations SNF    Equipment Recommendations  Other (comment)(has all needed equipment)    Recommendations for Other Services       Precautions / Restrictions Precautions Precautions: None Restrictions Weight Bearing Restrictions: No      Mobility  Bed Mobility Overal bed mobility: Needs Assistance Bed Mobility: Supine to Sit     Supine to sit: Mod assist     General bed  mobility comments: Pt performs supine to sit with mod assistance requiring assistance initiating motion and for LEs. Pt requires increased UE use to pull trunk into sitting  Transfers Overall transfer level: Needs assistance Equipment used: 1 person hand held assist Transfers: Stand Pivot Transfers   Stand pivot transfers: Mod assist       General transfer comment: Pt mod assist to stand pivot transfer from EOB to chair. Pt requires assistance initiating motion however once standing is able to maintain with min assist  Ambulation/Gait             General Gait Details: Not assessed this visit. Pt stand pivot transferred into chair  Stairs            Wheelchair Mobility    Modified Rankin (Stroke Patients Only)       Balance Overall balance assessment: Needs assistance   Sitting balance-Leahy Scale: Fair Sitting balance - Comments: Pt requires intermittent single UE support to maintain balance however can maintain sitting EOB without UE support for short period of time     Standing balance-Leahy Scale: Poor Standing balance comment: Pt requires min assist with hand held assistance to maintain standing demonstrating a posterior lean that does not improve with verbal cuing.                             Pertinent Vitals/Pain Pain Assessment: 0-10 Pain Score: (Unable to give number, no outward signs of pain) Pain Location: "Everywhere" Pain Intervention(s): Monitored during session;Limited activity within patient's tolerance    Home Living Family/patient expects to be discharged to:: Private  residence Living Arrangements: Alone Available Help at Discharge: Family;Available PRN/intermittently(sons live near by check in daily) Type of Home: House Home Access: Stairs to enter Entrance Stairs-Rails: Can reach both Entrance Stairs-Number of Steps: 4 Home Layout: One level Home Equipment: Walker - 2 wheels;Cane - single point;Bedside commode      Prior  Function Level of Independence: Independent with assistive device(s)         Comments: Pts son states that pt was independent in the home using either a SPC or RW. He states that she was able to perform most ADLs with minimal if any assistance. He states that if she were to go in community which she rarely did they would use a WC to increase safety. Pts son states that they have noticed more confusion of late however that she was generally appropriate an oriented however was "slower" processing information. Pt had one fall three weeks ago that was unwittnessed and resulted in no injuries     Hand Dominance        Extremity/Trunk Assessment   Upper Extremity Assessment Upper Extremity Assessment: Generalized weakness(Grossly 3+/5)    Lower Extremity Assessment Lower Extremity Assessment: Generalized weakness(Grossly 3+/5)       Communication   Communication: Expressive difficulties(new word stuttering and inappropriate word choices)  Cognition Arousal/Alertness: Awake/alert Behavior During Therapy: WFL for tasks assessed/performed(pt very pleasant) Overall Cognitive Status: Impaired/Different from baseline Area of Impairment: Orientation;Problem solving                 Orientation Level: Disoriented to;Place;Time;Situation           Problem Solving: Slow processing General Comments: Pt only oriented to person at this time. Pt has difficulty with word finding and uses inappropriate word choices throughout      General Comments      Exercises Other Exercises Other Exercises: Pt instructed in and tolerates ther-ex well including SLR, LAQ, and seated marching. Pt tolerates simple verbal cuing well.    Assessment/Plan    PT Assessment Patient needs continued PT services  PT Problem List Decreased strength;Decreased range of motion;Decreased activity tolerance;Decreased balance;Decreased coordination;Decreased mobility;Decreased cognition;Decreased safety  awareness;Pain       PT Treatment Interventions DME instruction;Gait training;Stair training;Functional mobility training;Therapeutic activities;Therapeutic exercise;Balance training;Neuromuscular re-education;Cognitive remediation;Patient/family education    PT Goals (Current goals can be found in the Care Plan section)  Acute Rehab PT Goals Patient Stated Goal: pt unable to state goal. Pts family states that want to know more about pts prognosis prior to deciding on goals PT Goal Formulation: With patient/family Time For Goal Achievement: 05/30/18 Potential to Achieve Goals: Fair    Frequency Min 2X/week   Barriers to discharge        Co-evaluation               AM-PAC PT "6 Clicks" Daily Activity  Outcome Measure Difficulty turning over in bed (including adjusting bedclothes, sheets and blankets)?: Unable Difficulty moving from lying on back to sitting on the side of the bed? : Unable Difficulty sitting down on and standing up from a chair with arms (e.g., wheelchair, bedside commode, etc,.)?: Unable Help needed moving to and from a bed to chair (including a wheelchair)?: A Lot Help needed walking in hospital room?: Total Help needed climbing 3-5 steps with a railing? : Total 6 Click Score: 7    End of Session Equipment Utilized During Treatment: Gait belt Activity Tolerance: Patient tolerated treatment well Patient left: in chair;with call bell/phone within reach;with chair  alarm set;with family/visitor present Nurse Communication: Mobility status;Other (comment)(vitals) PT Visit Diagnosis: Unsteadiness on feet (R26.81)    Time: 7579-7282 PT Time Calculation (min) (ACUTE ONLY): 39 min   Charges:              Yolonda Kida, SPT   Elvis Boot 05/16/2018, 11:13 AM

## 2018-05-16 NOTE — Consult Note (Signed)
[facility]      Assessment and Plan:  Multifocal, multilobar opacities involving both lungs.  Suspect metastatic cancer, most probably lung origin. Hypercalcemia, suspect related to underlying malignancy, though other causes (such as sarcoid could contribute)-- improved today.  Weakness, fatigue, dehydration-- may be related to hypercalcemia-- better today.   --Discussed with pt's son's today they are still against bronchoscopy if it is still high risk and unlikely to change her management plan as they do not want chemo. However there appears to be some confusion as daughter has reportedly said that she does want bronchoscopy. They will discuss this amongst themselves to see if they would still want Dr. Mortimer Fries to proceed  with bronchoscopy. --Continue RX with broad spectrum, will add anti-fungal. Will check ACE level with AM labs.  --Will check induced sputum and send for cytology and KOH for fungus.  --Agree with empiric steroids, would maintain on prednisone 20 mg daily at DC.    Date: 05/16/2018  MRN# [id number] BETHANIE BLOXOM October 19, 1926   Subjective: Pt is more awake today, though remains confused.   Medication:    Current Facility-Administered Medications:  .  0.9 %  sodium chloride infusion, , Intravenous, PRN, Fritzi Mandes, MD, Stopped at 05/15/18 1153 .  acetaminophen (TYLENOL) tablet 650 mg, 650 mg, Oral, Q6H PRN **OR** acetaminophen (TYLENOL) suppository 650 mg, 650 mg, Rectal, Q6H PRN, Wieting, Richard, MD .  amLODipine (NORVASC) tablet 5 mg, 5 mg, Oral, Daily, Wieting, Richard, MD, 5 mg at 05/15/18 1047 .  azithromycin (ZITHROMAX) 500 mg in sodium chloride 0.9 % 250 mL IVPB, 500 mg, Intravenous, Q24H, Loletha Grayer, MD, Stopped at 05/15/18 2138 .  budesonide (PULMICORT) nebulizer solution 0.5 mg, 0.5 mg, Nebulization, BID, Leslye Peer, Richard, MD, 0.5 mg at 05/16/18 0813 .  calcitonin (MIACALCIN) injection 200 Units, 200 Units, Intramuscular, Once, Fritzi Mandes,  MD .  ceFEPIme (MAXIPIME) 2 g in sodium chloride 0.9 % 100 mL IVPB, 2 g, Intravenous, Q12H, Loletha Grayer, MD, Stopped at 05/15/18 2246 .  fluconazole (DIFLUCAN) IVPB 200 mg, 200 mg, Intravenous, q1800, Fritzi Mandes, MD, Stopped at 05/15/18 2318 .  insulin aspart (novoLOG) injection 0-5 Units, 0-5 Units, Subcutaneous, QHS, Wieting, Richard, MD .  insulin aspart (novoLOG) injection 0-9 Units, 0-9 Units, Subcutaneous, TID WC, Loletha Grayer, MD, 2 Units at 05/15/18 1746 .  ipratropium-albuterol (DUONEB) 0.5-2.5 (3) MG/3ML nebulizer solution 3 mL, 3 mL, Nebulization, Q6H, Wieting, Richard, MD, 3 mL at 05/16/18 0813 .  lactated ringers 1,000 mL with potassium chloride 20 mEq infusion, , Intravenous, Continuous, Wieting, Richard, MD, Last Rate: 75 mL/hr at 05/16/18 0636 .  losartan (COZAAR) tablet 100 mg, 100 mg, Oral, Daily, Leslye Peer, Richard, MD, 100 mg at 05/15/18 1047 .  megestrol (MEGACE) 400 MG/10ML suspension 400 mg, 400 mg, Oral, Daily, Fritzi Mandes, MD, 400 mg at 05/15/18 1746 .  methylPREDNISolone sodium succinate (SOLU-MEDROL) 40 mg/mL injection 40 mg, 40 mg, Intravenous, Daily, Leslye Peer, Richard, MD, 40 mg at 05/15/18 1039 .  pantoprazole (PROTONIX) EC tablet 40 mg, 40 mg, Oral, Daily, Leslye Peer, Richard, MD, 40 mg at 05/15/18 1047   Allergies:  Ace inhibitors; Other; Ciprofloxacin; Macrodantin [nitrofurantoin macrocrystal]; Novocain [procaine]; and Sulfa antibiotics  Review of Systems: Could not be obtained due confusion.   Physical Examination:   VS: BP (!) 150/59 (BP Location: Left Arm)   Pulse 62   Temp (!) 97.3 F (36.3 C) (Oral)   Resp (!) 24   Ht 5\' 1"  (1.549 m)   Wt 49.3 kg  SpO2 97%   BMI 20.54 kg/m   General Appearance: No distress  Neuro:without focal findings, mental status confused.  HEENT: PERRLA, EOM intact Pulmonary: No wheezing, No rales  CardiovascularNormal S1,S2.  No m/r/g.  Abdomen: Benign, Soft, non-tender, No masses Renal:  No costovertebral  tenderness  GU:  No performed at this time. Endoc: No evident thyromegaly, no signs of acromegaly or Cushing features Skin:   warm, no rashes, no ecchymosis  Extremities: normal, no cyanosis, clubbing.        LABORATORY PANEL:   CBC Recent Labs  Lab 05/15/18 0450  WBC 10.2  HGB 14.7  HCT 41.6  PLT 213   ------------------------------------------------------------------------------------------------------------------  Chemistries  Recent Labs  Lab 05/16/18 0432  NA 141  K 3.6  CL 109  CO2 24  GLUCOSE 128*  BUN 11  CREATININE 0.63  CALCIUM 11.5*  MG 1.8  AST 24  ALT 15  ALKPHOS 39  BILITOT 1.1   ------------------------------------------------------------------------------------------------------------------  Cardiac Enzymes No results for input(s): TROPONINI in the last 168 hours. ------------------------------------------------------------  RADIOLOGY:  Dg Chest 2 View  Result Date: 05/14/2018 CLINICAL DATA:  Multiple lung masses. EXAM: CHEST - 2 VIEW COMPARISON:  CT chest 04/11/2018 and 12/19/2017. FINDINGS: Extensive bilateral airspace opacities are worse on the left and persist. Aeration in the left lower lobe appears worse than on the prior examination. Cardiac silhouette is obscured. No pneumothorax or pleural fluid. Aortic atherosclerosis is noted. IMPRESSION: Extensive bilateral airspace disease is worse on the left where it has progressed since the most recent examination. Findings could be due to multifocal pneumonia and/or carcinoma. Electronically Signed   By: Inge Rise M.D.   On: 05/14/2018 19:47   Mr Jeri Cos YF Contrast  Result Date: 05/15/2018 CLINICAL DATA:  Confusion, fatigue. Recurrent pneumonia. History of hypertension. EXAM: MRI HEAD WITHOUT AND WITH CONTRAST TECHNIQUE: Multiplanar, multiecho pulse sequences of the brain and surrounding structures were obtained without and with intravenous contrast. CONTRAST:  73mL MULTIHANCE GADOBENATE  DIMEGLUMINE 529 MG/ML IV SOLN COMPARISON:  CT HEAD October 17, 2016 FINDINGS: Multiple sequences are mildly or moderately motion degraded. INTRACRANIAL CONTENTS: No reduced diffusion to suggest acute ischemia. No susceptibility artifact to suggest hemorrhage. The ventricles and sulci are normal for patient's age. Patchy to confluent supratentorial white matter FLAIR T2 hyperintensities. Old RIGHT basal ganglia and RIGHT thalamus lacunar infarcts. Prominent basal ganglia and thalami perivascular spaces associated with chronic small vessel ischemic changes. No suspicious parenchymal signal, masses, mass effect. No abnormal extra-axial fluid collections. No extra-axial masses. No abnormal intraparenchymal or extra-axial enhancement. VASCULAR: Normal major intracranial vascular flow voids present at skull base. SKULL AND UPPER CERVICAL SPINE: No abnormal sellar expansion. No suspicious calvarial bone marrow signal. Craniocervical junction maintained. SINUSES/ORBITS: The mastoid air-cells and included paranasal sinuses are well-aerated.The included ocular globes and orbital contents are non-suspicious. Status post bilateral ocular lens implants. OTHER: None. IMPRESSION: 1. No acute intracranial process on this motion degraded examination. 2. Moderate to severe chronic small vessel ischemic changes and old lacunar infarcts. Electronically Signed   By: Elon Alas M.D.   On: 05/15/2018 00:38       Thank  you for the consultation and for allowing [facility] to assist in the care of your patient. Our recommendations are noted above.  Please contact us if we can be of further service.   Marda Stalker, M.D., F.C.C.P.  Board Certified in Internal Medicine, Pulmonary Medicine, [place], and Sleep Medicine.  [facility] Office Number: [phone]   05/16/2018

## 2018-05-16 NOTE — Progress Notes (Signed)
[name] NAME: Christina Coffey    MR#:  [id number]  DATE OF BIRTH:  08-Jan-1927  SUBJECTIVE:   Confused, weak, poor appetite and weight loss Family  in the room More awake today REVIEW OF SYSTEMS:   Review of Systems  Unable to perform ROS: Acuity of condition   Tolerating Diet:no Tolerating PT: pending  DRUG ALLERGIES:   Allergies  Allergen Reactions  . Ace Inhibitors Cough  . Other Other (See Comments)    SHAKING/TREMORS  . Ciprofloxacin Palpitations  . Macrodantin [Nitrofurantoin Macrocrystal] Palpitations  . Novocain [Procaine] Palpitations  . Sulfa Antibiotics Palpitations    VITALS:  Blood pressure (!) 150/59, pulse 62, temperature (!) 97.3 F (36.3 C), temperature source Oral, resp. rate (!) 24, height 5\' 1"  (1.549 m), weight 49.3 kg, SpO2 97 %.  PHYSICAL EXAMINATION:   Physical Exam  GENERAL:  84 y.o.-year-old patient lying in the bed with no acute distress. Thin, cachecticEYES: Pupils equal, round, reactive to light and accommodation. No scleral icterus. Extraocular muscles intact.  HEENT: Head atraumatic, normocephalic. Oropharynx and nasopharynx clear.  NECK:  Supple, no jugular venous distention. No thyroid enlargement, no tenderness.  LUNGS: distant breath sounds bilaterally, no wheezing, rales, rhonchi. No use of accessory muscles of respiration.  CARDIOVASCULAR: S1, S2 normal. No murmurs, rubs, or gallops.  ABDOMEN: Soft, nontender, nondistended. Bowel sounds present. No organomegaly or mass.  EXTREMITIES: No cyanosis, clubbing or edema b/l.    NEUROLOGIC: Cranial nerves II through XII are intact. No focal Motor or sensory deficits b/l.  Globally weak PSYCHIATRIC:  patient is alert but not communicating much SKIN: No obvious rash, lesion, or ulcer.   LABORATORY PANEL:  CBC Recent Labs  Lab 05/15/18 0450  WBC 10.2  HGB 14.7  HCT 41.6  PLT 213    Chemistries  Recent Labs  Lab  05/16/18 0432  NA 141  K 3.6  CL 109  CO2 24  GLUCOSE 128*  BUN 11  CREATININE 0.63  CALCIUM 11.5*  MG 1.8  AST 24  ALT 15  ALKPHOS 39  BILITOT 1.1   Cardiac Enzymes No results for input(s): TROPONINI in the last 168 hours. RADIOLOGY:  Mr Jeri Cos Wo Contrast  Result Date: 05/15/2018 CLINICAL DATA:  Confusion, fatigue. Recurrent pneumonia. History of hypertension. EXAM: MRI HEAD WITHOUT AND WITH CONTRAST TECHNIQUE: Multiplanar, multiecho pulse sequences of the brain and surrounding structures were obtained without and with intravenous contrast. CONTRAST:  48mL MULTIHANCE GADOBENATE DIMEGLUMINE 529 MG/ML IV SOLN COMPARISON:  CT HEAD October 17, 2016 FINDINGS: Multiple sequences are mildly or moderately motion degraded. INTRACRANIAL CONTENTS: No reduced diffusion to suggest acute ischemia. No susceptibility artifact to suggest hemorrhage. The ventricles and sulci are normal for patient's age. Patchy to confluent supratentorial white matter FLAIR T2 hyperintensities. Old RIGHT basal ganglia and RIGHT thalamus lacunar infarcts. Prominent basal ganglia and thalami perivascular spaces associated with chronic small vessel ischemic changes. No suspicious parenchymal signal, masses, mass effect. No abnormal extra-axial fluid collections. No extra-axial masses. No abnormal intraparenchymal or extra-axial enhancement. VASCULAR: Normal major intracranial vascular flow voids present at skull base. SKULL AND UPPER CERVICAL SPINE: No abnormal sellar expansion. No suspicious calvarial bone marrow signal. Craniocervical junction maintained. SINUSES/ORBITS: The mastoid air-cells and included paranasal sinuses are well-aerated.The included ocular globes and orbital contents are non-suspicious. Status post bilateral ocular lens implants. OTHER: None. IMPRESSION: 1. No acute intracranial process on this motion degraded examination. 2. Moderate to severe chronic  small vessel ischemic changes and old lacunar infarcts.  Electronically Signed   By: Elon Alas M.D.   On: 05/15/2018 00:38   ASSESSMENT AND PLAN:  Christina Coffey  is a 84 y.o. female with a known history of recurrent pneumonia that has not gotten better.  Started with having the flu and pneumonia back in March.  She had a course of antibiotics at that time and another course afterwards.  She saw Dr. Mortimer Fries last month and prescribed 30 days of Augmentin.   1. Persistent Infiltirate right and left lungs with weight loss, poor appetite, hypercalcemia and long standing h/o smoking worrisome for Lung malignancy -seen by Dr Juanell Fairly. -d/w Dr Ravishankar--d/c IV abxs and change to po augmentin -family understands poor prognosis and currently pulm recommends no furthter w/u since high risk for procedures -wbc normal, no fever, procalcitonin x2 negative suggestive more of malignancy   2.  Severe hypercalcemia.  IV fluid hydration.  likely due to cancerous process.  -Calcitonin SQ  3.  Hypokalemia.  Replace potassium orally and an IV fluids.  4.  Acute encephalopathy.  MRI of the brain with contrast --negative  5.  severe Malnutrition -dietitian input noted.  6.  Hypertension on Norvasc and losartan.  D/w pt's children. They request hospice evaluation. Will place consult. Plan is fo pt to d/c to SNF with hospice. Pt is DNR  Case discussed with Care Management/Social Worker. Management plans discussed with the patient, family and they are in agreement.  CODE STATUS: dnr  DVT Prophylaxis: lovenox  TOTAL TIME TAKING CARE OF THIS PATIENT: *30* minutes.  >50% time spent on counselling and coordination of care  POSSIBLE D/C IN *few DAYS, DEPENDING ON CLINICAL CONDITION.  Note: This dictation was prepared with Dragon dictation along with smaller phrase technology. Any transcriptional errors that result from this process are unintentional.  Fritzi Mandes M.D on 05/16/2018 at 7:55 PM  Between 7am to 6pm - Pager - [phone]  After 6pm go to  [email or web address] - password [email or web address] [facility]  Office  [phone]  CC: Primary care physician; Idelle Crouch, MDPatient ID: Christina Coffey, female   DOB: Jun 10, 1927, 84 y.o.   MRN: [id number]

## 2018-05-16 NOTE — Progress Notes (Signed)
Patient ID: Christina Coffey, female   DOB: 1927-02-05, 84 y.o.   MRN: [id number] ACP note  2 sons and daughter  present  Diagnosis: Persistent multifocal pneumonia, severe hypercalcemia, hypokalemia, acute encephalopathy, impaired fasting glucose, hypertension  CODE STATUS DNR per [facility]  Plan.  evaluation for hospice Time spent on ACP discussion 18 minutes

## 2018-05-17 DIAGNOSIS — Z515 Encounter for palliative care: Secondary | ICD-10-CM

## 2018-05-17 DIAGNOSIS — R9389 Abnormal findings on diagnostic imaging of other specified body structures: Secondary | ICD-10-CM

## 2018-05-17 DIAGNOSIS — R531 Weakness: Secondary | ICD-10-CM

## 2018-05-17 DIAGNOSIS — Z7189 Other specified counseling: Secondary | ICD-10-CM

## 2018-05-17 LAB — ANGIOTENSIN CONVERTING ENZYME: Angiotensin-Converting Enzyme: 28 U/L (ref 14–82)

## 2018-05-17 LAB — COMPREHENSIVE METABOLIC PANEL
ALBUMIN: 3.7 g/dL (ref 3.5–5.0)
ALK PHOS: 53 U/L (ref 38–126)
ALT: 24 U/L (ref 0–44)
ANION GAP: 6 (ref 5–15)
AST: 27 U/L (ref 15–41)
BILIRUBIN TOTAL: 1.2 mg/dL (ref 0.3–1.2)
BUN: 10 mg/dL (ref 8–23)
CALCIUM: 11.6 mg/dL — AB (ref 8.9–10.3)
CO2: 25 mmol/L (ref 22–32)
Chloride: 109 mmol/L (ref 98–111)
Creatinine, Ser: 0.57 mg/dL (ref 0.44–1.00)
GLUCOSE: 122 mg/dL — AB (ref 70–99)
POTASSIUM: 3.2 mmol/L — AB (ref 3.5–5.1)
Sodium: 140 mmol/L (ref 135–145)
TOTAL PROTEIN: 6.2 g/dL — AB (ref 6.5–8.1)

## 2018-05-17 LAB — GLUCOSE, CAPILLARY
GLUCOSE-CAPILLARY: 106 mg/dL — AB (ref 70–99)
Glucose-Capillary: 111 mg/dL — ABNORMAL HIGH (ref 70–99)

## 2018-05-17 MED ORDER — IPRATROPIUM-ALBUTEROL 0.5-2.5 (3) MG/3ML IN SOLN: 3.0000 mL | Freq: Two times a day (BID) | RESPIRATORY_TRACT | Status: DC

## 2018-05-17 MED ORDER — IPRATROPIUM-ALBUTEROL 0.5-2.5 (3) MG/3ML IN SOLN: 3.0000 mL | Freq: Four times a day (QID) | RESPIRATORY_TRACT | 0 refills | Status: AC | PRN

## 2018-05-17 MED ORDER — AMOXICILLIN-POT CLAVULANATE 875-125 MG PO TABS: 1.0000 | ORAL_TABLET | Freq: Two times a day (BID) | ORAL | 0 refills | Status: AC

## 2018-05-17 MED ORDER — PREDNISONE 20 MG PO TABS: 20.0000 mg | ORAL_TABLET | Freq: Every day | ORAL | 0 refills | Status: AC

## 2018-05-17 MED ORDER — POTASSIUM CHLORIDE 20 MEQ PO PACK
40.0000 meq | PACK | Freq: Two times a day (BID) | ORAL | Status: DC
  Administered 2018-05-17: 40 meq via ORAL
  Filled 2018-05-17: qty 2

## 2018-05-17 NOTE — Progress Notes (Signed)
MEDICATION RELATED CONSULT NOTE - INITIAL   Pharmacy Consult for electrolyte management Indication: hypokalemia, hypomagnesemia, hypercalcemia  Allergies  Allergen Reactions  . Ace Inhibitors Cough  . Other Other (See Comments)    SHAKING/TREMORS  . Ciprofloxacin Palpitations  . Macrodantin [Nitrofurantoin Macrocrystal] Palpitations  . Novocain [Procaine] Palpitations  . Sulfa Antibiotics Palpitations    Patient Measurements: Height: 5\' 1"  (154.9 cm) Weight: 108 lb 11 oz (49.3 kg) IBW/kg (Calculated) : 47.8  Vital Signs: Temp: 97.5 F (36.4 C) (08/23 0609) Temp Source: Oral (08/23 0609) BP: 106/90 (08/23 0609) Pulse Rate: 87 (08/23 0609) Intake/Output from previous day: 08/22 0701 - 08/23 0700 In: 1774.7 [P.O.:240; I.V.:1434.7; IV Piggyback:100] Out: 1200 [Urine:1200] Intake/Output from this shift: No intake/output data recorded.  Labs: Recent Labs    05/14/18 1834 05/15/18 0450 05/16/18 0432 05/17/18 0334  WBC 12.5* 10.2  --   --   HGB 16.5* 14.7  --   --   HCT 47.1* 41.6  --   --   PLT 267 213  --   --   CREATININE 0.75 0.60 0.63 0.57  MG  --  1.6* 1.8  --   ALBUMIN  --   --  3.3* 3.7  PROT  --   --  5.7* 6.2*  AST  --   --  24 27  ALT  --   --  15 24  ALKPHOS  --   --  39 53  BILITOT  --   --  1.1 1.2   Estimated Creatinine Clearance: 36 mL/min (by C-G formula based on SCr of 0.57 mg/dL).  Medical History: Past Medical History:  Diagnosis Date  . Arthritis   . GERD (gastroesophageal reflux disease)   . Hypertension   . Pneumonia     Assessment: 83 year-old female with multiple forms of electrolyte abnormalities: specifically hypokalemia, hypomagnesemia, hypercalcemia. She has received the following:  05/15/18 1) KCl 29mEq po bid 2) Magnesium sulfate 2 grams IV once 3) calcitonin 4 units/kg IM once  05/16/18 calcitonin 4 units/kg IM once  She also has LR20 at 75 ml/hr since 8/20  Goal of Therapy:  K 3.5-5.1 mmol/L Magnesium: 1.7-2.4  mg/dL Ca: 8.9-10.3 mg/dL  Plan:  After discussion with Dr Posey Pronto we will stop the calcitonin based on the fact that the effectiveness wanes after 24-48 hours. Additionally, this is most likely the result of a malignant process for which the family has decided against treatment. No further follow-up warranted for hypercalcemia.  Potassium level is again low this morning. We will replace with KCl 36mEq po bid x 2 doses  Follow-up labs in the morning  Dallie Piles, PharmD 05/17/2018,11:20 AM

## 2018-05-17 NOTE — Progress Notes (Signed)
Christina Coffey to be D/C'd to [facility] per MD order.  Discussed prescriptions and follow up appointments with the patient. Prescriptions given to patient, medication list explained in detail with son.  Allergies as of 05/17/2018      Reactions   Ace Inhibitors Cough   Other Other (See Comments)   SHAKING/TREMORS   Ciprofloxacin Palpitations   Macrodantin [nitrofurantoin Macrocrystal] Palpitations   Novocain [procaine] Palpitations   Sulfa Antibiotics Palpitations      Medication List    STOP taking these medications   albuterol 108 (90 Base) MCG/ACT inhaler Commonly known as:  PROVENTIL HFA;VENTOLIN HFA   AMBULATORY NON FORMULARY MEDICATION     TAKE these medications   amLODipine 5 MG tablet Commonly known as:  NORVASC Take 5 mg by mouth daily.   amoxicillin-clavulanate 875-125 MG tablet Commonly known as:  AUGMENTIN Take 1 tablet by mouth every 12 (twelve) hours.   aspirin 81 MG tablet Take 81 mg by mouth daily.   ipratropium-albuterol 0.5-2.5 (3) MG/3ML Soln Commonly known as:  DUONEB Take 3 mLs by nebulization every 6 (six) hours as needed.   losartan 100 MG tablet Commonly known as:  COZAAR Take 100 mg by mouth daily.   megestrol 40 MG/ML suspension Commonly known as:  MEGACE Take 400 mg by mouth daily.   pantoprazole 40 MG tablet Commonly known as:  PROTONIX Take 40 mg by mouth daily.   potassium chloride SA 20 MEQ tablet Commonly known as:  K-DUR,KLOR-CON Take 20 mEq by mouth daily.   predniSONE 20 MG tablet Commonly known as:  DELTASONE Take 1 tablet (20 mg total) by mouth daily with breakfast. Start taking on:  05/18/2018       Vitals:   05/17/18 0609 05/17/18 0810  BP: 106/90   Pulse: 87   Resp: 16   Temp: (!) 97.5 F (36.4 C)   SpO2: 97% 98%    Skin clean, dry and intact without evidence of skin break down, no evidence of skin tears noted. IV catheter discontinued intact. Site without signs and symptoms of complications. Dressing and  pressure applied. Pt denies pain at this time. No complaints noted.  An After Visit Summary was printed and given to the patient. Report given to Kim at [facility] for transport.  Christina Coffey

## 2018-05-17 NOTE — Care Management Important Message (Signed)
Important Message  Patient Details  Name: Christina Coffey MRN: [id number] Date of Birth: 07/10/1927   Medicare Important Message Given:  Yes    Juliann Pulse A Murl Golladay 05/17/2018, 11:58 AM

## 2018-05-17 NOTE — Progress Notes (Signed)
ID Family not pursuing aggressive management. Discussed with Dr.Patel- augmentin for 5 more days Will sign off Call if needed

## 2018-05-17 NOTE — Consult Note (Signed)
Consultation Note Date: 05/17/2018   Patient Name: Christina Coffey  DOB: December 17, 1926  MRN: [id number]  Age / Sex: 84 y.o., female  PCP: Idelle Crouch, MD Referring Physician: Fritzi Mandes, MD  Reason for Consultation: Establishing goals of care  HPI/Patient Profile: 85 y.o. female  with past medical history of hypertension, GERD, arthritis, smoking, and recurrent pneumonia admitted on 05/14/2018 with weakness. Patient has experienced recurrent pneumonia, starting in March 2019. She has been on multiple course of antibiotics including pulmonology evaluation recommending Augmentin for 30 days. Patient has had 20lb weight loss and poor nutritional status. In ED, found to have bilateral infiltrates on xray and hypercalcemia. Concern for lung malignancy. Pulmonology consulted and recommending no further workup with high risk for procedures. Palliative medicine consultation for goals of care.   Clinical Assessment and Goals of Care:  I have reviewed medical records, discussed with Dr. Posey Pronto, and met with son Ronalee Belts) in family waiting room to discuss diagnosis, prognosis, GOC, EOL wishes, disposition and options.  Patient wakes to voice. She is alert to person and place. She denies pain or discomfort. Ronalee Belts prefer we speak in family waiting room.   Introduced Palliative Medicine as specialized medical care for people living with serious illness. It focuses on providing relief from the symptoms and stress of a serious illness. The goal is to improve quality of life for both the patient and the family.  We discussed a brief life review of the patient. Widowed. Three adult children. Prior to this spring, patient has been a very lively and independent individual, still living alone and driving. Ronalee Belts shares her decline in health starting in March with flu and pneumonia. Children have been more involved in her care since May. She  has remained at home but sons check on her at least three times a day. Appetite is poor. She has lost at least 20 lbs. Ronalee Belts shares a great decline in functional status in the last week.   Discussed course of hospitalization including diagnoses and interventions. Ronalee Belts recalls his conversations with Dr. Posey Pronto and Dr. Nona Dell.   I attempted to elicit values and goals of care important to the patient and family. Advanced directives, concepts specific to code status, artifical feeding and hydration, and rehospitalization were considered and discussed. Ronalee Belts confirms that he is documented POA. He shares that him and his siblings make decisions together. Ronalee Belts shares that his mother has spoken of not pursuing chemotherapy (approprately so) and that his siblings and him have agreed against high risk procedures such as bronch. They wish to focus on her comfort and quality of life.   The difference between aggressive medical intervention and comfort care was considered in light of the patient's goals of care.   Ronalee Belts speaks of attempting to have a conversation with his mother last night when she was lucid. He is not sure she completely understands that she likely has a malignancy, but also feels at peace knowing he "laid it all out."   Discussed plan for SNF for rehab. Son  is agreeable with palliative follow-up. I did educated on difference between palliative versus hospice. Educated on hospice philosophy and goal of comfort, quality and dignity at EOL. We did discuss concern with recurrent hospitalization secondary to hypercalcemia, poor nutritional intake, and dehydration--for which Ronalee Belts does not believe his mother would want. We discussed transition to hospice services when they are ready. Ronalee Belts appreciative of information.   Questions and concerns were addressed.  Hard Choices booklet left for review. The family was encouraged to call with questions or concerns.  PMT will continue to support holistically.     SUMMARY OF RECOMMENDATIONS    DNR. No further oncology work-up.   Family wishes to focus on her comfort and quality of life, understanding poor prognosis with likely malignancy.   Plan is for SNF for rehab. Son agreeable with outpatient palliative referral. Educated on hospice philosophy and transition to hospice services when family is ready. Son confirms understanding.   Code Status/Advance Care Planning:  DNR  Symptom Management:   Per attending  Palliative Prophylaxis:   Aspiration, Bowel Regimen, Delirium Protocol, Frequent Pain Assessment, Oral Care and Turn Reposition  Psycho-social/Spiritual:   Desire for further Chaplaincy support:yes  Additional Recommendations: Caregiving  Support/Resources and Education on Hospice  Prognosis:   Unable to determine: Poor long-term prognosis with recurrent pneumonia likely lung malignancy, hypercalcemia, weight loss, declining functional and nutritional status.   Discharge Planning: [facility] for rehab with Palliative care service follow-up      Primary Diagnoses: Present on Admission: . Pneumonia   I have reviewed the medical record, interviewed the patient and family, and examined the patient. The following aspects are pertinent.  Past Medical History:  Diagnosis Date  . Arthritis   . GERD (gastroesophageal reflux disease)   . Hypertension   . Pneumonia    Social History   Socioeconomic History  . Marital status: Widowed    Spouse name: Not on file  . Number of children: 3  . Years of education: 6  . Highest education level: Doctorate  Occupational History  . Not on file  Social Needs  . Financial resource strain: Not hard at all  . Food insecurity:    Worry: Not on file    Inability: Not on file  . Transportation needs:    Medical: Not on file    Non-medical: Not on file  Tobacco Use  . Smoking status: Former Smoker    Packs/day: 1.00    Years: 38.00    Pack years: 38.00  .  Smokeless tobacco: Former Systems developer    Quit date: 07/16/1982  Substance and Sexual Activity  . Alcohol use: Yes    Alcohol/week: 5.0 standard drinks    Types: 5 Glasses of wine per week    Comment: wine  . Drug use: No  . Sexual activity: Not Currently  Lifestyle  . Physical activity:    Days per week: Not on file    Minutes per session: Not on file  . Stress: Not on file  Relationships  . Social connections:    Talks on phone: Not on file    Gets together: Not on file    Attends religious service: Not on file    Active member of club or organization: Not on file    Attends meetings of clubs or organizations: Not on file    Relationship status: Not on file  Other Topics Concern  . Not on file  Social History Narrative  . Not on file  Family History  Problem Relation Age of Onset  . CVA Mother   . Colon cancer Mother   . Heart failure Father   . Breast cancer Neg Hx    Scheduled Meds: . amLODipine  5 mg Oral Daily  . amoxicillin-clavulanate  1 tablet Oral Q12H  . budesonide (PULMICORT) nebulizer solution  0.5 mg Nebulization BID  . ipratropium-albuterol  3 mL Nebulization BID  . losartan  100 mg Oral Daily  . megestrol  400 mg Oral Daily  . pantoprazole  40 mg Oral Daily  . potassium chloride  40 mEq Oral BID  . predniSONE  20 mg Oral Q breakfast   Continuous Infusions: . sodium chloride Stopped (05/15/18 1153)  . lactated ringers with kcl 75 mL/hr at 05/17/18 0955   PRN Meds:.sodium chloride, acetaminophen **OR** acetaminophen Medications Prior to Admission:  Prior to Admission medications   Medication Sig Start Date End Date Taking? Authorizing Provider  amLODipine (NORVASC) 5 MG tablet Take 5 mg by mouth daily.   Yes [provider]  aspirin 81 MG tablet Take 81 mg by mouth daily.   Yes [provider]  losartan (COZAAR) 100 MG tablet Take 100 mg by mouth daily.   Yes [provider]  megestrol (MEGACE) 40 MG/ML suspension Take 400 mg  by mouth daily.   Yes [provider]  pantoprazole (PROTONIX) 40 MG tablet Take 40 mg by mouth daily.   Yes [provider]  potassium chloride SA (K-DUR,KLOR-CON) 20 MEQ tablet Take 20 mEq by mouth daily.   Yes [provider]  albuterol (PROVENTIL HFA;VENTOLIN HFA) 108 (90 Base) MCG/ACT inhaler Inhale 2 puffs into the lungs every 4 (four) hours as needed for wheezing or shortness of breath. Patient not taking: Reported on 05/14/2018 04/22/18   Flora Lipps, MD  AMBULATORY NON FORMULARY MEDICATION Medication Name: Incentive spirometer. Use 10-15 times per day DX:J49.9 Patient not taking: Reported on 05/14/2018 04/22/18   Flora Lipps, MD  AMBULATORY NON FORMULARY MEDICATION Medication Name: Flutter valve Use 10-15 times per day. DX:J49.9 Patient not taking: Reported on 05/14/2018 04/22/18   Flora Lipps, MD   Allergies  Allergen Reactions  . Ace Inhibitors Cough  . Other Other (See Comments)    SHAKING/TREMORS  . Ciprofloxacin Palpitations  . Macrodantin [Nitrofurantoin Macrocrystal] Palpitations  . Novocain [Procaine] Palpitations  . Sulfa Antibiotics Palpitations   Review of Systems  Constitutional: Positive for activity change, appetite change and fatigue.  Gastrointestinal: Positive for constipation.  Neurological: Positive for weakness.   Physical Exam  Constitutional: She is easily aroused. She appears ill.  HENT:  Head: Normocephalic and atraumatic.  Pulmonary/Chest: No accessory muscle usage. No tachypnea. No respiratory distress.  Neurological: She is easily aroused.  Wakes to voice, oriented to person/place  Skin: Skin is warm and dry.  Psychiatric: Cognition and memory are impaired. She is inattentive.  Nursing note and vitals reviewed.  Vital Signs: BP 106/90 (BP Location: Right Arm)   Pulse 87   Temp (!) 97.5 F (36.4 C) (Oral)   Resp 16   Ht _0  (1.549 m)   Wt 49.3 kg   SpO2 98%   BMI 20.54 kg/m  Pain Scale: 0-10   Pain Score:  Asleep   SpO2: SpO2: 98 % O2 Device:SpO2: 98 % O2 Flow Rate: .   IO: Intake/output summary:   Intake/Output Summary (Last 24 hours) at 05/17/2018 1333 Last data filed at 05/17/2018 0343 Gross per 24 hour  Intake 1774.7 ml  Output  1200 ml  Net 574.7 ml    LBM: Last BM Date: 05/14/18 Baseline Weight: Weight: 49 kg Most recent weight: Weight: 49.3 kg     Palliative Assessment/Data: PPS 40%   Flowsheet Rows     Most Recent Value  Intake Tab  Referral Department  Hospitalist  Unit at Time of Referral  Med/Surg Unit  Palliative Care Primary Diagnosis  Cancer  Date Notified  05/17/18  Palliative Care Type  New Palliative care  Reason Not Seen  Consult cancelled  Reason for referral  Clarify Goals of Care  Date of Admission  05/14/18  Date first seen by Palliative Care  05/17/18  # of days Palliative referral response time  0 Day(s)  # of days IP prior to Palliative referral  3  Clinical Assessment  Palliative Performance Scale Score  40%  Psychosocial & Spiritual Assessment  Palliative Care Outcomes  Patient/Family meeting held?  Yes  Who was at the meeting?  son Ronalee Belts)  [place] goals of care, Counseled regarding hospice, Provided psychosocial or spiritual support, ACP counseling assistance, Provided end of life care assistance, Linked to palliative care logitudinal support      Time In: 1230 Time Out: 1340 Time Total: 65mn Greater than 50%  of this time was spent counseling and coordinating care related to the above assessment and plan.  Signed by:  MIhor Dow FNP-C Palliative Medicine Team  Phone: [phone]: [phone]  Please contact Palliative Medicine Team phone at [phone] questions and concerns.  For individual provider: See AShea Evans

## 2018-05-17 NOTE — Consult Note (Signed)
[facility]      Assessment and Plan:  Multifocal, multilobar opacities involving both lungs.  Suspect metastatic cancer, most probably lung origin. Hypercalcemia, suspect related to underlying malignancy, though other causes (such as sarcoid could contribute)-- improved today.  Weakness, fatigue, dehydration-- may be related to hypercalcemia-- better today But remains confused, calcium still eleavted  --Discussed with pt's son's today(Michael) they are at peace now and the goals of care is for palliative services and rehab. I have explained the most likely diagnosis is malignancy. Patient is  still high risk and unlikely to change her management plan as they do not want chemo.  --Continue RX with broad spectrum, follow up ID recs  --Agree with empiric steroids, would maintain on prednisone 20 mg daily at DC.    Poor prognosis DNR/DNI status   Date: 05/17/2018  MRN# [id number] Christina Coffey [birth year]   Subjective: Pt is more awake today, though remains confused.  Seems very weak No apparent distress [name] the son at bedside-he is very comfortable with decision not to pursue any type of invasive procedure   Medication:    Current Facility-Administered Medications:  .  0.9 %  sodium chloride infusion, , Intravenous, PRN, Fritzi Mandes, MD, Stopped at 05/15/18 1153 .  acetaminophen (TYLENOL) tablet 650 mg, 650 mg, Oral, Q6H PRN **OR** acetaminophen (TYLENOL) suppository 650 mg, 650 mg, Rectal, Q6H PRN, Wieting, Richard, MD .  amLODipine (NORVASC) tablet 5 mg, 5 mg, Oral, Daily, Leslye Peer, Richard, MD, 5 mg at 05/17/18 0950 .  amoxicillin-clavulanate (AUGMENTIN) 875-125 MG per tablet 1 tablet, 1 tablet, Oral, Q12H, Fritzi Mandes, MD, 1 tablet at 05/17/18 0950 .  budesonide (PULMICORT) nebulizer solution 0.5 mg, 0.5 mg, Nebulization, BID, Leslye Peer, Richard, MD, 0.5 mg at 05/17/18 0813 .  ipratropium-albuterol (DUONEB) 0.5-2.5 (3) MG/3ML nebulizer solution 3 mL, 3 mL,  Nebulization, BID, Posey Pronto, Sona, MD .  lactated ringers 1,000 mL with potassium chloride 20 mEq infusion, , Intravenous, Continuous, Wieting, Richard, MD, Last Rate: 75 mL/hr at 05/17/18 0955 .  losartan (COZAAR) tablet 100 mg, 100 mg, Oral, Daily, Leslye Peer, Richard, MD, 100 mg at 05/17/18 0950 .  megestrol (MEGACE) 400 MG/10ML suspension 400 mg, 400 mg, Oral, Daily, Fritzi Mandes, MD, 400 mg at 05/17/18 0955 .  pantoprazole (PROTONIX) EC tablet 40 mg, 40 mg, Oral, Daily, Leslye Peer, Richard, MD, 40 mg at 05/17/18 0950 .  potassium chloride (KLOR-CON) packet 40 mEq, 40 mEq, Oral, BID, Fritzi Mandes, MD, 40 mEq at 05/17/18 1238 .  predniSONE (DELTASONE) tablet 20 mg, 20 mg, Oral, Q breakfast, Fritzi Mandes, MD, 20 mg at 05/17/18 0950   Allergies:  Ace inhibitors; Other; Ciprofloxacin; Macrodantin [nitrofurantoin macrocrystal]; Novocain [procaine]; and Sulfa antibiotics  Review of Systems: Could not be obtained due confusion.   Physical Examination:   VS: BP 106/90 (BP Location: Right Arm)   Pulse 87   Temp (!) 97.5 F (36.4 C) (Oral)   Resp 16   Ht 5\' 1"  (1.549 m)   Wt 49.3 kg   SpO2 98%   BMI 20.54 kg/m   General Appearance: No distress  Neuro:without focal findings, mental status confused.  HEENT: PERRLA, EOM intact Pulmonary: No wheezing, No rales  CardiovascularNormal S1,S2.  No m/r/g.  Abdomen: Benign, Soft, non-tender, No masses Renal:  No costovertebral tenderness  GU:  No performed at this time. Endoc: No evident thyromegaly, no signs of acromegaly or Cushing features Skin:   warm, no rashes, no ecchymosis  Extremities: normal, no cyanosis, clubbing.  LABORATORY PANEL:   CBC Recent Labs  Lab 05/15/18 0450  WBC 10.2  HGB 14.7  HCT 41.6  PLT 213   ------------------------------------------------------------------------------------------------------------------  Chemistries  Recent Labs  Lab 05/16/18 0432 05/17/18 0334  NA 141 140  K 3.6 3.2*  CL 109 109   CO2 24 25  GLUCOSE 128* 122*  BUN 11 10  CREATININE 0.63 0.57  CALCIUM 11.5* 11.6*  MG 1.8  --   AST 24 27  ALT 15 24  ALKPHOS 39 53  BILITOT 1.1 1.2   ------------------------------------------------------------------------------------------------------------------  Family are satisfied with Plan of action and management. All questions answered  Corrin Parker, M.D.  [facility]  Medical Director [place] Director [facility] Cardio-Pulmonary Department

## 2018-05-17 NOTE — Progress Notes (Signed)
New referral for outpatient Palliative to follow at [facility] received from [facility]. Patient information faxed to referral. Flo Shanks RN, BSN, [place] [facility] liaison [phone]

## 2018-05-17 NOTE — Clinical Social Work Note (Signed)
CSW informed by MD and also confirm with patient's son, that the son prefers [facility]. CSW has begun auth with [facility] and faxed in clinicals. CSW has spoken to son regarding having to await auth and palliative care to follow. Shela Leff MSW,LCSW [phone]

## 2018-05-17 NOTE — Progress Notes (Signed)
[facility] NAME: Christina Coffey    MR#:  [id number]  DATE OF BIRTH:  1927-08-21  SUBJECTIVE:   Confused, weak, poor appetite and weight loss Family  in the room--trying to eat muffin More awake today REVIEW OF SYSTEMS:   Review of Systems  Unable to perform ROS: Acuity of condition   Tolerating Diet:very little Tolerating PT: SNF  DRUG ALLERGIES:   Allergies  Allergen Reactions  . Ace Inhibitors Cough  . Other Other (See Comments)    SHAKING/TREMORS  . Ciprofloxacin Palpitations  . Macrodantin [Nitrofurantoin Macrocrystal] Palpitations  . Novocain [Procaine] Palpitations  . Sulfa Antibiotics Palpitations    VITALS:  Blood pressure 106/90, pulse 87, temperature (!) 97.5 F (36.4 C), temperature source Oral, resp. rate 16, height 5\' 1"  (1.549 m), weight 49.3 kg, SpO2 98 %.  PHYSICAL EXAMINATION:   Physical Exam  GENERAL:  84 y.o.-year-old patient lying in the bed with no acute distress. Thin, cachectic EYES: Pupils equal, round, reactive to light and accommodation. No scleral icterus.  HEENT: Head atraumatic, normocephalic. Oropharynx and nasopharynx clear.  NECK:  Supple, no jugular venous distention. No thyroid enlargement, no tenderness.  LUNGS: distant breath sounds bilaterally, no wheezing, rales, rhonchi. No use of accessory muscles of respiration.  CARDIOVASCULAR: S1, S2 normal. No murmurs, rubs, or gallops.  ABDOMEN: Soft, nontender, nondistended. Bowel sounds present. No organomegaly or mass.  EXTREMITIES: No cyanosis, clubbing or edema b/l.    NEUROLOGIC: Cranial nerves II through XII are intact. No focal Motor or sensory deficits b/l.  Globally weak PSYCHIATRIC:  patient is alert but not communicating much SKIN: No obvious rash, lesion, or ulcer.   LABORATORY PANEL:  CBC Recent Labs  Lab 05/15/18 0450  WBC 10.2  HGB 14.7  HCT 41.6  PLT 213    Chemistries  Recent Labs  Lab 05/16/18 0432  05/17/18 0334  NA 141 140  K 3.6 3.2*  CL 109 109  CO2 24 25  GLUCOSE 128* 122*  BUN 11 10  CREATININE 0.63 0.57  CALCIUM 11.5* 11.6*  MG 1.8  --   AST 24 27  ALT 15 24  ALKPHOS 39 53  BILITOT 1.1 1.2   Cardiac Enzymes No results for input(s): TROPONINI in the last 168 hours. RADIOLOGY:  No results found. ASSESSMENT AND PLAN:  Christina Coffey  is a 84 y.o. female with a known history of recurrent pneumonia that has not gotten better.  Started with having the flu and pneumonia back in March.  She had a course of antibiotics at that time and another course afterwards.  She saw Dr. Mortimer Fries last month and prescribed 30 days of Augmentin.   1. Persistent Infiltirate right and left lungs with weight loss, poor appetite, hypercalcemia and long standing h/o smoking worrisome for Lung malignancy -seen by Dr Juanell Fairly. -d/w Dr Ravishankar--d/c IV abxs and change to po augmentin -family understands poor prognosis and currently pulm recommends no furthter w/u since high risk for procedures -wbc normal, no fever, procalcitonin x2 negative suggestive more of malignancy   2.  Severe hypercalcemia.  received IV fluid hydration.  likely due to cancerous process.  -Calcitonin SQ x2 doses  3.  Hypokalemia.  Replace potassium orally and an IV fluids.  4.  Acute encephalopathy due to  Dehydration, hypercalcemia - MRI of the brain with contrast --negative  5.  severe Malnutrition -dietitian input noted.  6.  Hypertension on Norvasc and losartan.  D/w pt's  childrenWill start with palliative care to follow at the facility  Plan is to pt to d/c to SNF with palliative care--once insurance authorization obtained Pt is DNR  Case discussed with Care Management/Social Worker. Management plans discussed with the patient, family and they are in agreement.  CODE STATUS: dnr  DVT Prophylaxis: lovenox  TOTAL TIME TAKING CARE OF THIS PATIENT: *30* minutes.  >50% time spent on counselling and coordination of  care  POSSIBLE D/C IN 1-2DAYS, DEPENDING ON CLINICAL CONDITION.  Note: This dictation was prepared with Dragon dictation along with smaller phrase technology. Any transcriptional errors that result from this process are unintentional.  Fritzi Mandes M.D on 05/17/2018 at 9:45 AM  Between 7am to 6pm - Pager - [phone]  After 6pm go to [email or web address] - password [email or web address]  Office  [phone]  CC: Primary care physician; Idelle Crouch, MDPatient ID: Christina Coffey, female   DOB: 04-Feb-1927, 84 y.o.   MRN: [id number]

## 2018-05-17 NOTE — Clinical Social Work Note (Signed)
Patient's son chose [facility]. Patient to discharge today to [facility]. Discharge information sent to [facility]. [facility] is aware. Auth received by [facility]: [id number]. Patient's son aware and patient to transport via EMS. CSW notified Santiago Glad with Palliative as well. Shela Leff MSW,LCSW [phone]

## 2018-05-17 NOTE — Discharge Summary (Signed)
Christina Coffey at [facility] NAME: Christina Coffey    MR#:  [id number]  DATE OF BIRTH:  Aug 07, 1927  DATE OF ADMISSION:  05/14/2018 ADMITTING PHYSICIAN: Loletha Grayer, MD  DATE OF DISCHARGE: 05/17/2018  PRIMARY CARE PHYSICIAN: Idelle Crouch, MD    ADMISSION DIAGNOSIS:  Hypokalemia [E87.6] Weakness [R53.1] Pneumonia of both lungs due to infectious organism, unspecified part of lung [J18.9]  DISCHARGE DIAGNOSIS:  Acute metabolic encephalopathy Failure to thrive Hypercalcemia Bilateral Lung consolidation--highly suspicious for malignancy ( no further w/u per family request)  SECONDARY DIAGNOSIS:   Past Medical History:  Diagnosis Date  . Arthritis   . GERD (gastroesophageal reflux disease)   . Hypertension   . Pneumonia     HOSPITAL COURSE:   AdeleHoltis a89 y.o.femalewith a known history of recurrent pneumonia that has not gotten better. Started with having the flu and pneumonia back in March. She had a course of antibiotics at that time and another course afterwards. She saw Dr. Princess Bruins month and prescribed 30 days of Augmentin.   1. Persistent Infiltirate right and left lungs with weight loss, poor appetite, hypercalcemia and long standing h/o smoking worrisome for Lung malignancy -seen by Dr Juanell Fairly. -d/w Dr Ravishankar--d/c IV abxs and change to po augmentin -family understands poor prognosis and currently pulm recommends no furthter w/u since high risk for procedures -wbc normal, no fever, procalcitonin x2 negative suggestive more of malignancy  2. Severe hypercalcemia. received IV fluid hydration. likely due to cancerous process.  -Calcitonin SQ x2 doses  3. Hypokalemia. Replace potassium orally and an IV fluids.  4. Acute encephalopathy due to  Dehydration, hypercalcemia -MRI of the brain with contrast --negative  5. severe Malnutrition -dietitian input noted.  6. Hypertension on Norvasc and  losartan.  D/w pt's childrenWill start with palliative care to follow at the facility  Plan is to pt to d/c to SNF with palliative care--once insurance authorization obtained Pt is DNR CONSULTS OBTAINED:  Treatment Team:  Laverle Hobby, MD Tsosie Billing, MD  DRUG ALLERGIES:   Allergies  Allergen Reactions  . Ace Inhibitors Cough  . Other Other (See Comments)    SHAKING/TREMORS  . Ciprofloxacin Palpitations  . Macrodantin [Nitrofurantoin Macrocrystal] Palpitations  . Novocain [Procaine] Palpitations  . Sulfa Antibiotics Palpitations    DISCHARGE MEDICATIONS:   Allergies as of 05/17/2018      Reactions   Ace Inhibitors Cough   Other Other (See Comments)   SHAKING/TREMORS   Ciprofloxacin Palpitations   Macrodantin [nitrofurantoin Macrocrystal] Palpitations   Novocain [procaine] Palpitations   Sulfa Antibiotics Palpitations      Medication List    STOP taking these medications   albuterol 108 (90 Base) MCG/ACT inhaler Commonly known as:  PROVENTIL HFA;VENTOLIN HFA   AMBULATORY NON FORMULARY MEDICATION     TAKE these medications   amLODipine 5 MG tablet Commonly known as:  NORVASC Take 5 mg by mouth daily.   amoxicillin-clavulanate 875-125 MG tablet Commonly known as:  AUGMENTIN Take 1 tablet by mouth every 12 (twelve) hours.   aspirin 81 MG tablet Take 81 mg by mouth daily.   ipratropium-albuterol 0.5-2.5 (3) MG/3ML Soln Commonly known as:  DUONEB Take 3 mLs by nebulization every 6 (six) hours as needed.   losartan 100 MG tablet Commonly known as:  COZAAR Take 100 mg by mouth daily.   megestrol 40 MG/ML suspension Commonly known as:  MEGACE Take 400 mg by mouth daily.   pantoprazole 40 MG tablet Commonly  known as:  PROTONIX Take 40 mg by mouth daily.   potassium chloride SA 20 MEQ tablet Commonly known as:  K-DUR,KLOR-CON Take 20 mEq by mouth daily.   predniSONE 20 MG tablet Commonly known as:  DELTASONE Take 1 tablet (20 mg  total) by mouth daily with breakfast. Start taking on:  05/18/2018       If you experience worsening of your admission symptoms, develop shortness of breath, life threatening emergency, suicidal or homicidal thoughts you must seek medical attention immediately by calling 911 or calling your MD immediately  if symptoms less severe.  You Must read complete instructions/literature along with all the possible adverse reactions/side effects for all the Medicines you take and that have been prescribed to you. Take any new Medicines after you have completely understood and accept all the possible adverse reactions/side effects.   Please note  You were cared for by a hospitalist during your hospital stay. If you have any questions about your discharge medications or the care you received while you were in the hospital after you are discharged, you can call the unit and asked to speak with the hospitalist on call if the hospitalist that took care of you is not available. Once you are discharged, your primary care physician will handle any further medical issues. Please note that NO REFILLS for any discharge medications will be authorized once you are discharged, as it is imperative that you return to your primary care physician (or establish a relationship with a primary care physician if you do not have one) for your aftercare needs so that they can reassess your need for medications and monitor your lab values.    DATA REVIEW:   CBC  Recent Labs  Lab 05/15/18 0450  WBC 10.2  HGB 14.7  HCT 41.6  PLT 213    Chemistries  Recent Labs  Lab 05/16/18 0432 05/17/18 0334  NA 141 140  K 3.6 3.2*  CL 109 109  CO2 24 25  GLUCOSE 128* 122*  BUN 11 10  CREATININE 0.63 0.57  CALCIUM 11.5* 11.6*  MG 1.8  --   AST 24 27  ALT 15 24  ALKPHOS 39 53  BILITOT 1.1 1.2    Microbiology Results   Recent Results (from the past 240 hour(s))  Blood Culture (routine x 2)     Status: None (Preliminary  result)   Collection Time: 05/14/18  8:22 PM  Result Value Ref Range Status   Specimen Description BLOOD LEFT AC  Final   Special Requests   Final    BOTTLES DRAWN AEROBIC AND ANAEROBIC Blood Culture adequate volume   Culture   Final    NO GROWTH 3 DAYS Performed at [facility], [address] [place], [place] [address]    Report Status PENDING  Incomplete  Blood Culture (routine x 2)     Status: None (Preliminary result)   Collection Time: 05/14/18  8:53 PM  Result Value Ref Range Status   Specimen Description BLOOD RIGHT ANTECUBITAL  Final   Special Requests   Final    BOTTLES DRAWN AEROBIC AND ANAEROBIC Blood Culture adequate volume   Culture   Final    NO GROWTH 3 DAYS Performed at [facility], [address] [place], [place] [address]    Report Status PENDING  Incomplete  MRSA PCR Screening     Status: None   Collection Time: 05/14/18 11:10 PM  Result Value Ref Range Status   MRSA by PCR  NEGATIVE NEGATIVE Final    Comment:        The GeneXpert MRSA Assay (FDA approved for NASAL specimens only), is one component of a comprehensive MRSA colonization surveillance program. It is not intended to diagnose MRSA infection nor to guide or monitor treatment for MRSA infections. Performed at [facility], [address], [place], [place] [address]     RADIOLOGY:  No results found.   Management plans discussed with the patient, family and they are in agreement.  CODE STATUS:     Code Status Orders  (From admission, onward)         Start     Ordered   05/16/18 1658  Do not attempt resuscitation (DNR)  Continuous    Question Answer Comment  In the event of cardiac or respiratory ARREST Do not call a "code blue"   In the event of cardiac or respiratory ARREST Do not perform Intubation, CPR, defibrillation or ACLS   In the event of cardiac or respiratory ARREST Use medication by any route, position, wound care, and other measures to  relive pain and suffering. May use oxygen, suction and manual treatment of airway obstruction as needed for comfort.      05/16/18 1658        Code Status History    Date Active Date Inactive Code Status Order ID Comments User Context   05/14/2018 2126 05/16/2018 1658 Full Code [id number]  Loletha Grayer, MD ED   07/23/2014 1529 07/25/2014 1532 Full Code [id number]  Newman Pies, MD Inpatient      TOTAL TIME TAKING CARE OF THIS PATIENT: *40* minutes.    Fritzi Mandes M.D on 05/17/2018 at 2:34 PM  Between 7am to 6pm - Pager - [phone] After 6pm go to [email or web address] - password [email or web address] [facility]  Office  [phone]  CC: Primary care physician; Idelle Crouch, MD

## 2018-05-19 LAB — CULTURE, BLOOD (ROUTINE X 2)
CULTURE: NO GROWTH
Culture: NO GROWTH
SPECIAL REQUESTS: ADEQUATE
Special Requests: ADEQUATE

## 2018-05-20 LAB — PTH-RELATED PEPTIDE

## 2018-05-26 DEATH — deceased

## 2018-10-04 IMAGING — CT CT CHEST W/O CM
2 of 4 series · 14 of 36 positions shown, 17 images · non-contrast
Comparison: 12/19/2017

CLINICAL DATA: Cavitary opacities in the lungs for follow up.

EXAM:
CT CHEST WITHOUT CONTRAST
TECHNIQUE: Multidetector CT imaging of the chest was performed following the
standard protocol without IV contrast.

[Series 2: chest · axial · 0.55mm/px · z∈[-1202,-936]mm · 11 of 159 slices shown, 14 images (1 of 2)]
[im 13/159  mediastinal]
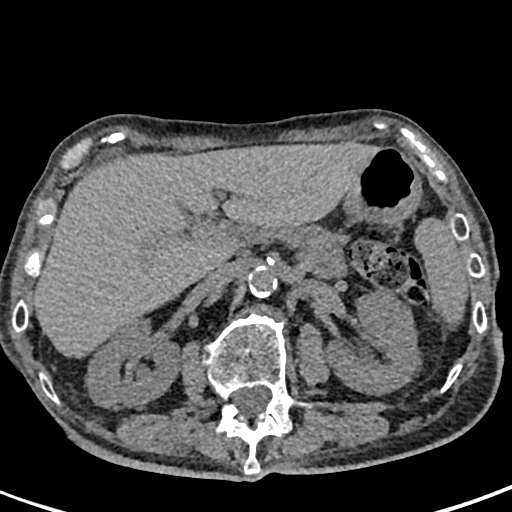
[im 13/159  lung]
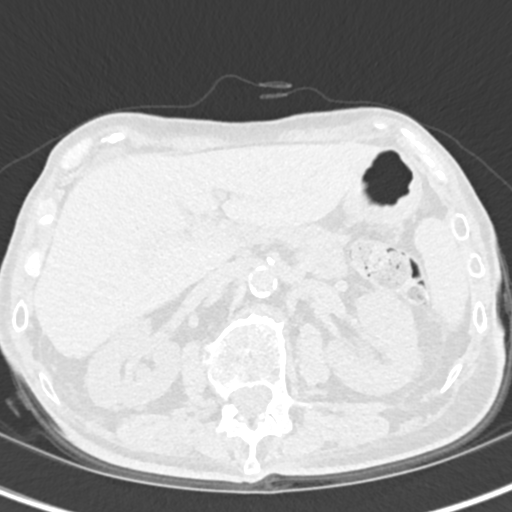
[im 25/159  lung]
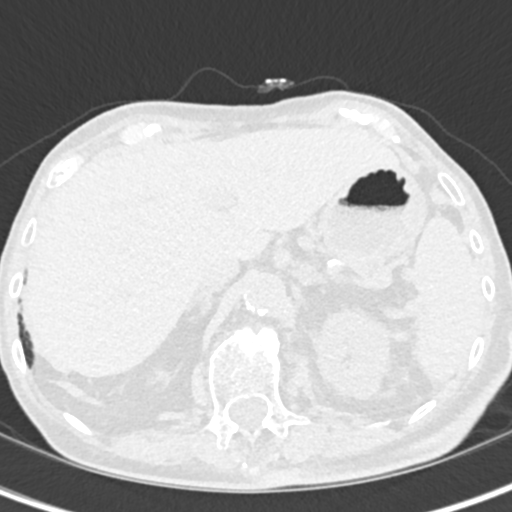
[im 37/159  lung]
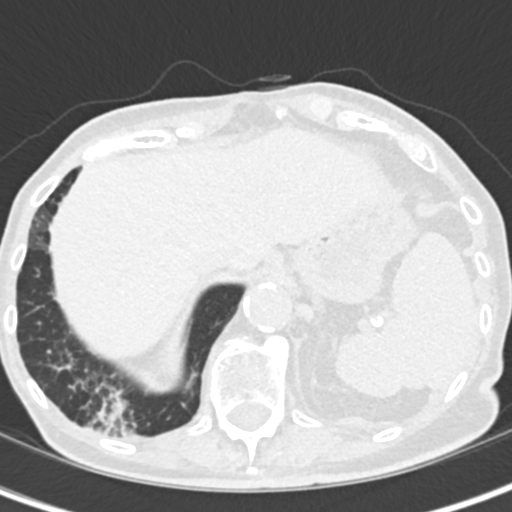
[im 49/159  lung]
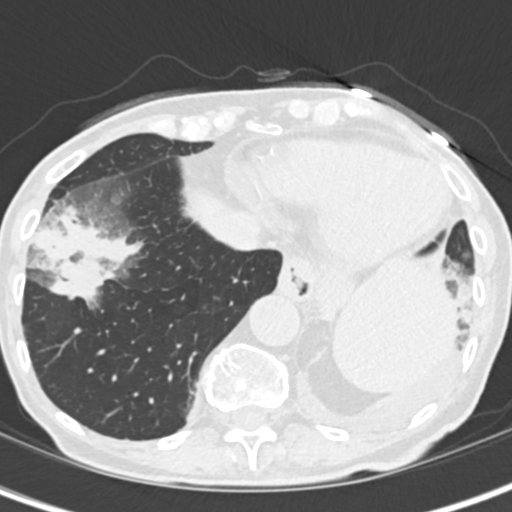
[im 61/159  mediastinal]
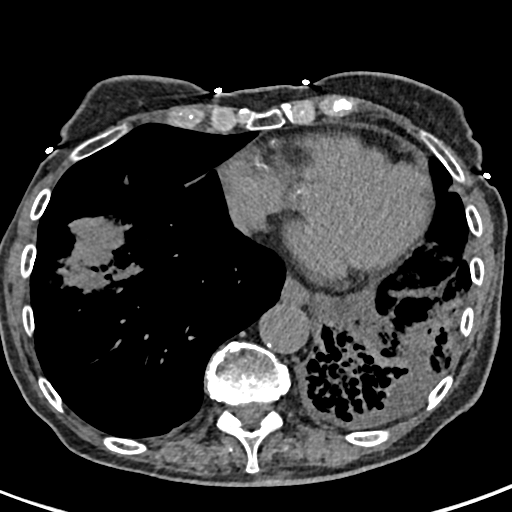
[im 61/159  lung]
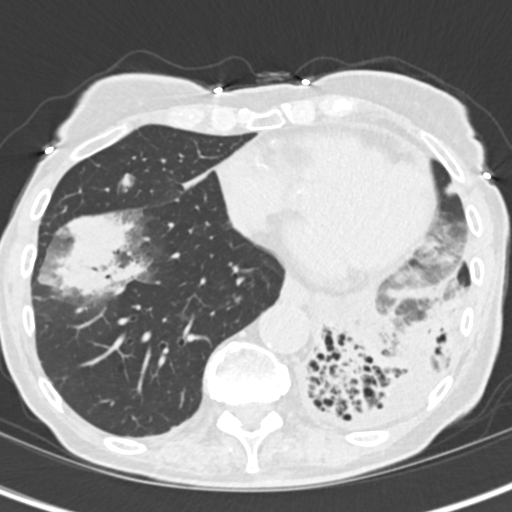
[im 86/159  lung]
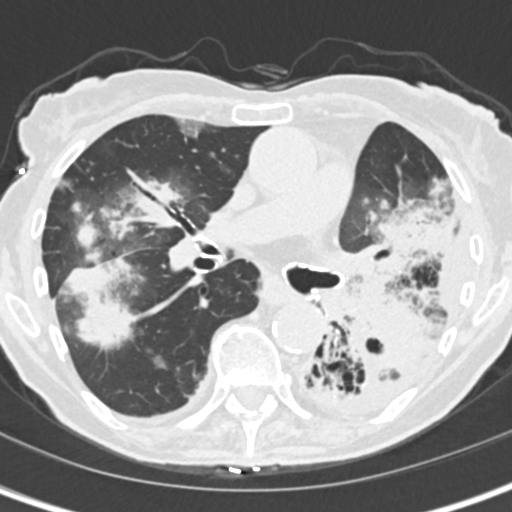
[im 98/159  lung]
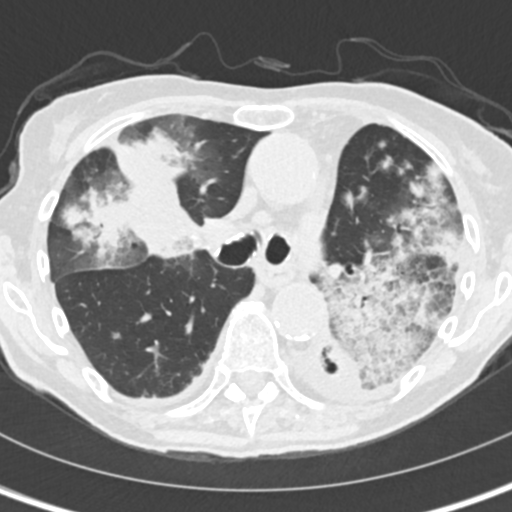
[im 110/159  lung]
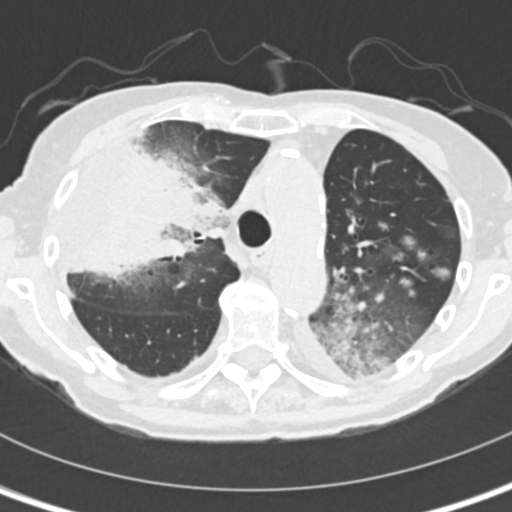
[im 122/159  mediastinal]
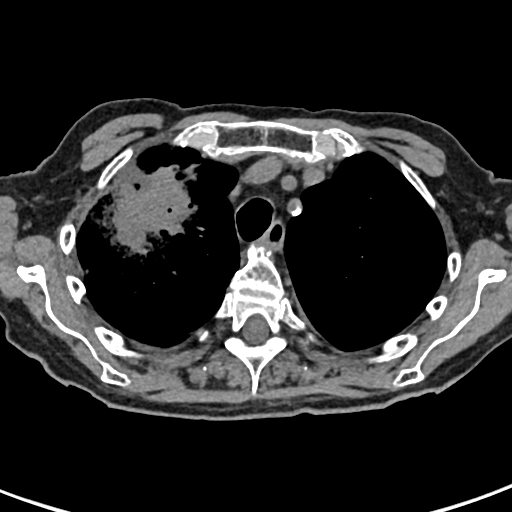
[im 122/159  lung]
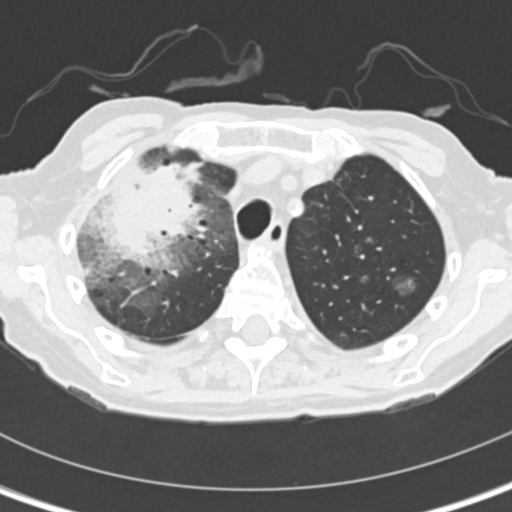
[im 134/159  lung]
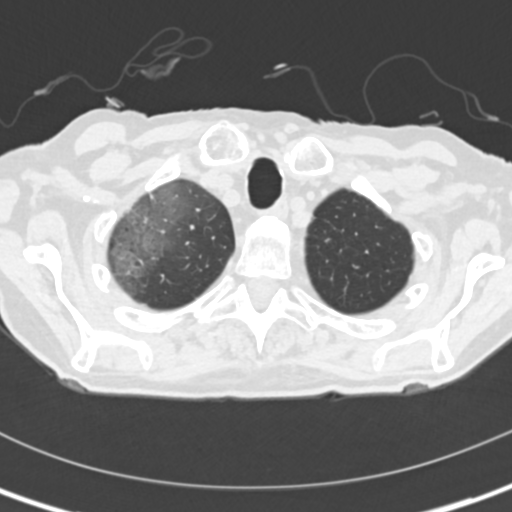
[im 146/159  lung]
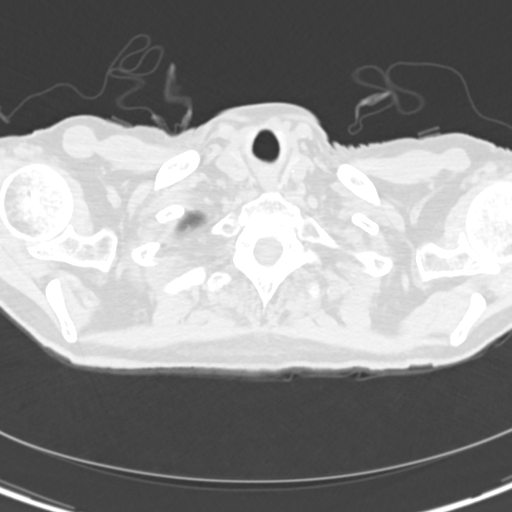

[Series 5: chest · coronal · 0.55mm/px · 3 of 138 slices shown (2 of 2)]
[im 28/138  lung]
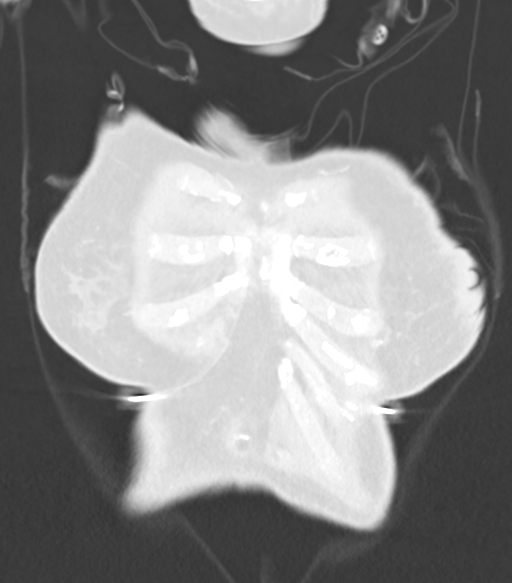
[im 55/138  lung]
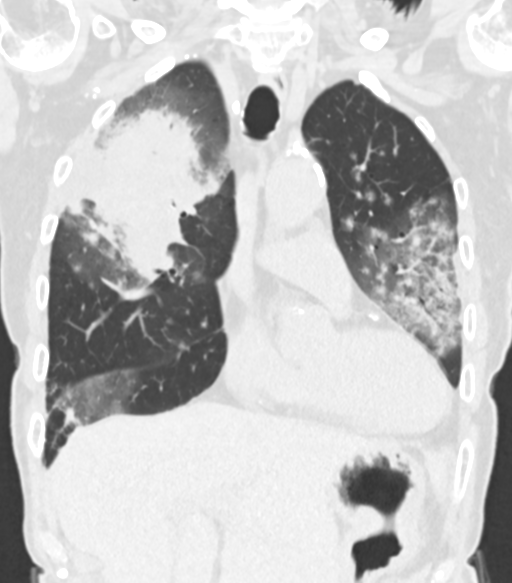
[im 83/138  lung]
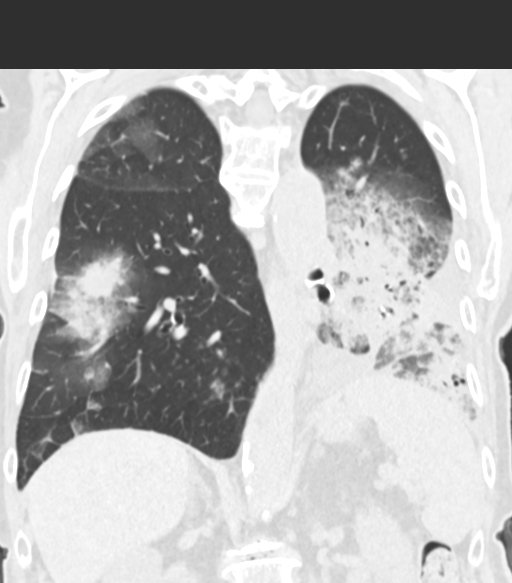

[14 of 36 positions shown; findings below may reference images not displayed]

FINDINGS: Cardiovascular: Coronary, aortic arch, and branch vessel
atherosclerotic vascular disease. Ascending aortic ectasia at
cm.

Mediastinum/Nodes: AP window lymph node 0.7 cm in short axis on
image 60/2, formerly 0.8 cm. Similar scattered paratracheal and
prevascular lymph nodes are present. Today's exam was performed
without IV contrast making it difficult to assess for hilar and
infrahilar lymph nodes given the surrounding opacities.

Small type 1 hiatal hernia.

Lungs/Pleura: Worsening confluent right upper lobe airspace opacity,
currently about 8.7 by 7.6 cm, representing a confluence of the
prior smaller airspace opacities. Increase in surrounding
ground-glass opacity.

In the right middle lobe there is multifocal nodular airspace
opacities with surrounding ground-glass opacity, significantly
increased from prior, including a more confluent area of
consolidation measuring about 4.5 by 4.2 cm on image 65/3.

Increase in the airspace opacity anteriorly in the right lower lobe,
about 5.6 by 4.1 cm and previously 4.7 by 3.3 cm, with marginal
ground-glass opacities. Similar additional patchy airspace opacities
posteriorly in the right lower lobe. There is some new nodular
opacities medially in the right lower lobe.

In the left upper lobe there are new confluent nodular airspace
opacities with surrounding ground-glass opacity and confluent
consolidation along lower portions of the left upper lobe and
lingula. Continued honeycombing or varicoid bronchiectasis in the
left lower lobe with thick septations between the varicoid
airspaces. Probable new cavitation in the left upper lobe
posteriorly on image 72/3 within 1 of the prior regions of airspace
opacity.

Small pleural effusion.

Upper Abdomen: Abdominal aortic atherosclerotic calcification.

Musculoskeletal: Thoracic spondylosis.
IMPRESSION: 1. Progressive/worsening bilateral consolidations with surrounding
ground-glass opacities. Progressive scattered nodularity, confluent
in some of the regions of consolidation. Continued volume loss,
consolidation, and varicoid bronchiectasis in the left lower lobe.
Possibilities may include cryptogenic organizing pneumonia,
multifocal adenocarcinoma, fungal infectious disease such as
aspergillosis, mucormycosis, coccidiomycosis, candidiasis,
cryptococcosis; mycobacterial infection; Rickettsia pneumonia; viral
processes; granulomatosis with polyangiitis; eosinophilic lung
disease; pulmonary lymphoma; or hemorrhagic lung metastatic disease.
2.  Aortic Atherosclerosis ([id number]).
3. Small type 1 hiatal hernia
4. Small mediastinal lymph nodes may be reactive but are not
pathologically enlarged.
5. Coronary atherosclerosis.
6. Small left pleural effusion.

## 2018-11-06 IMAGING — CR DG CHEST 2V
2 series · 2 of 2 positions shown · non-contrast
Comparison: CT chest 04/11/2018 and 12/19/2017.

CLINICAL DATA: Multiple lung masses.

EXAM:
CHEST - 2 VIEW

[chest lat]
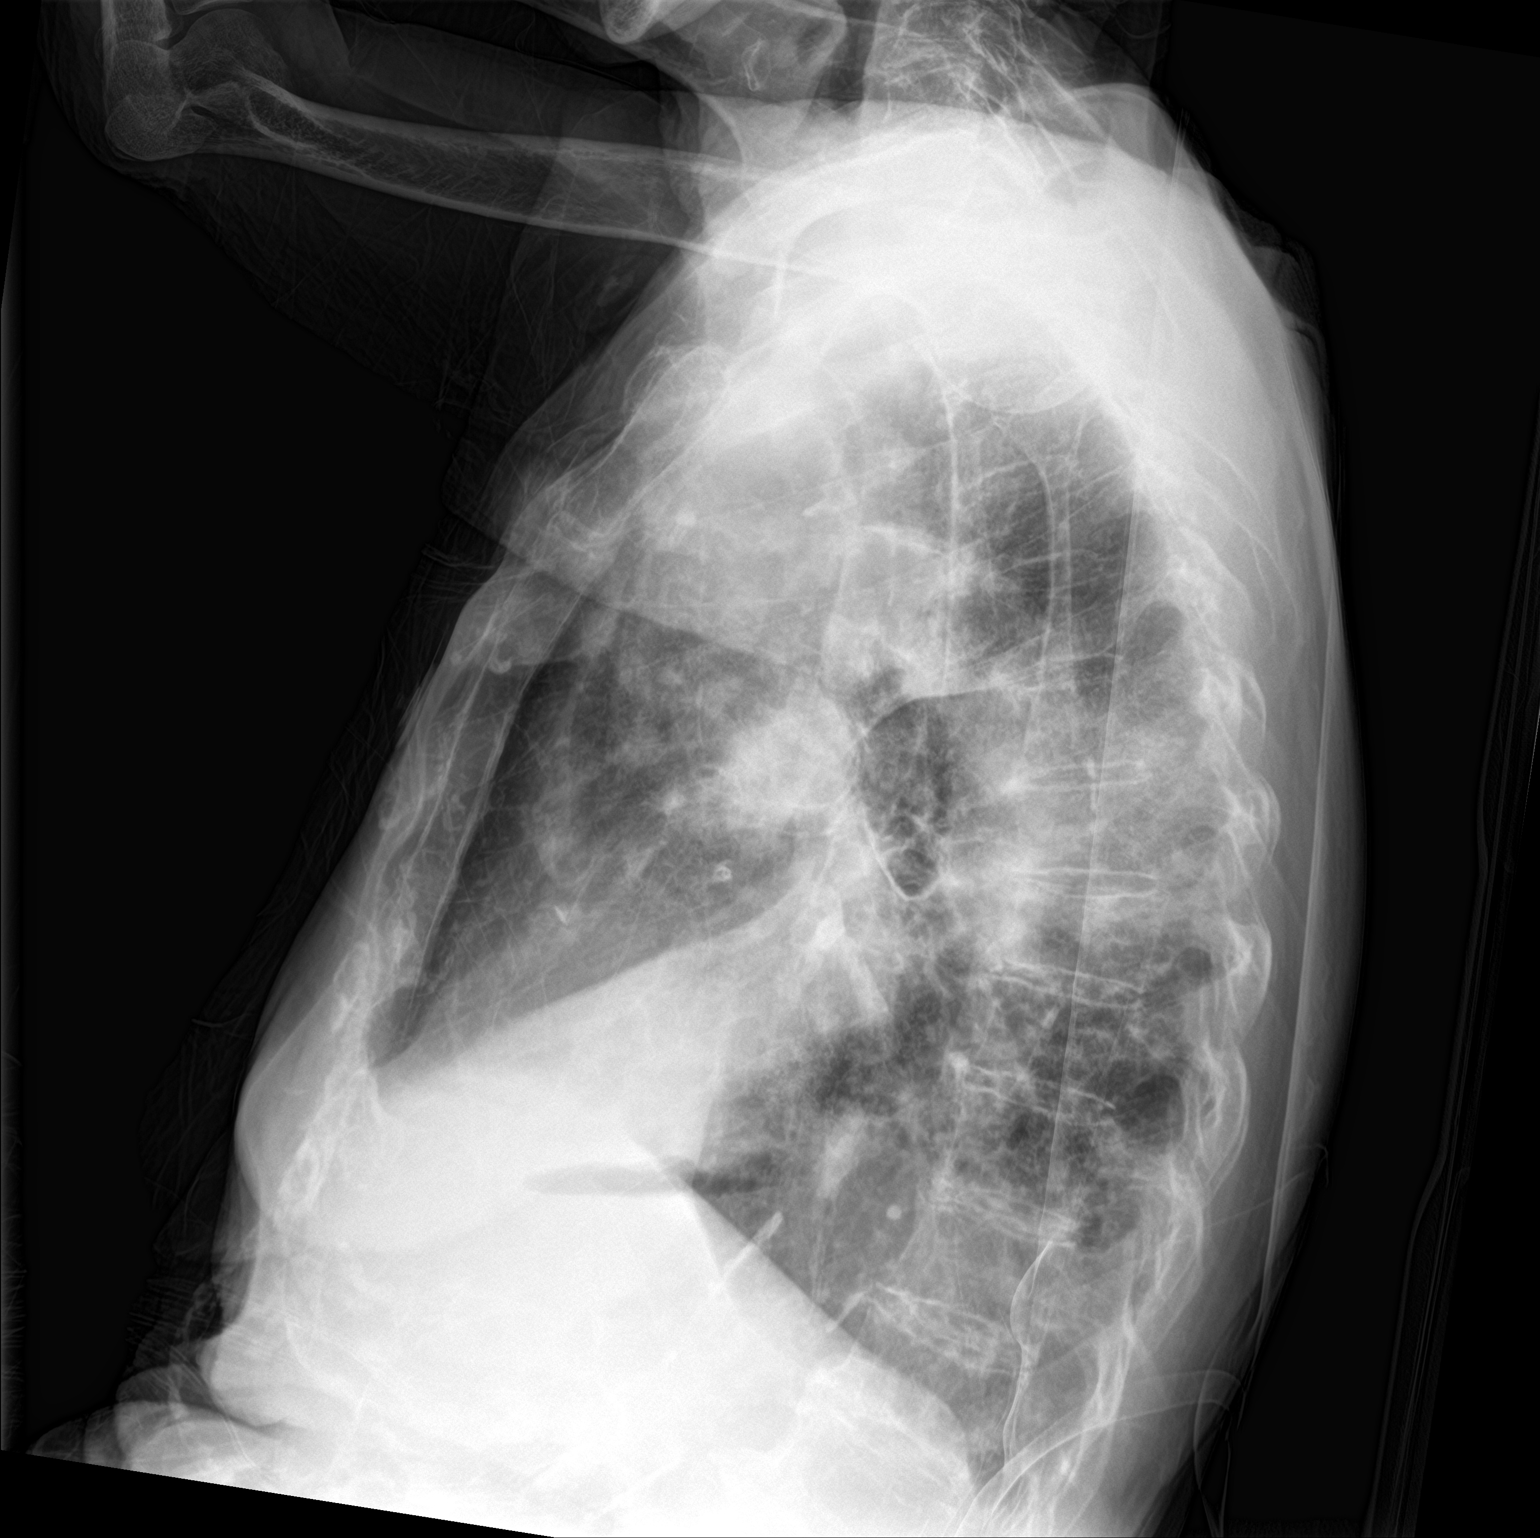

[chest ap]
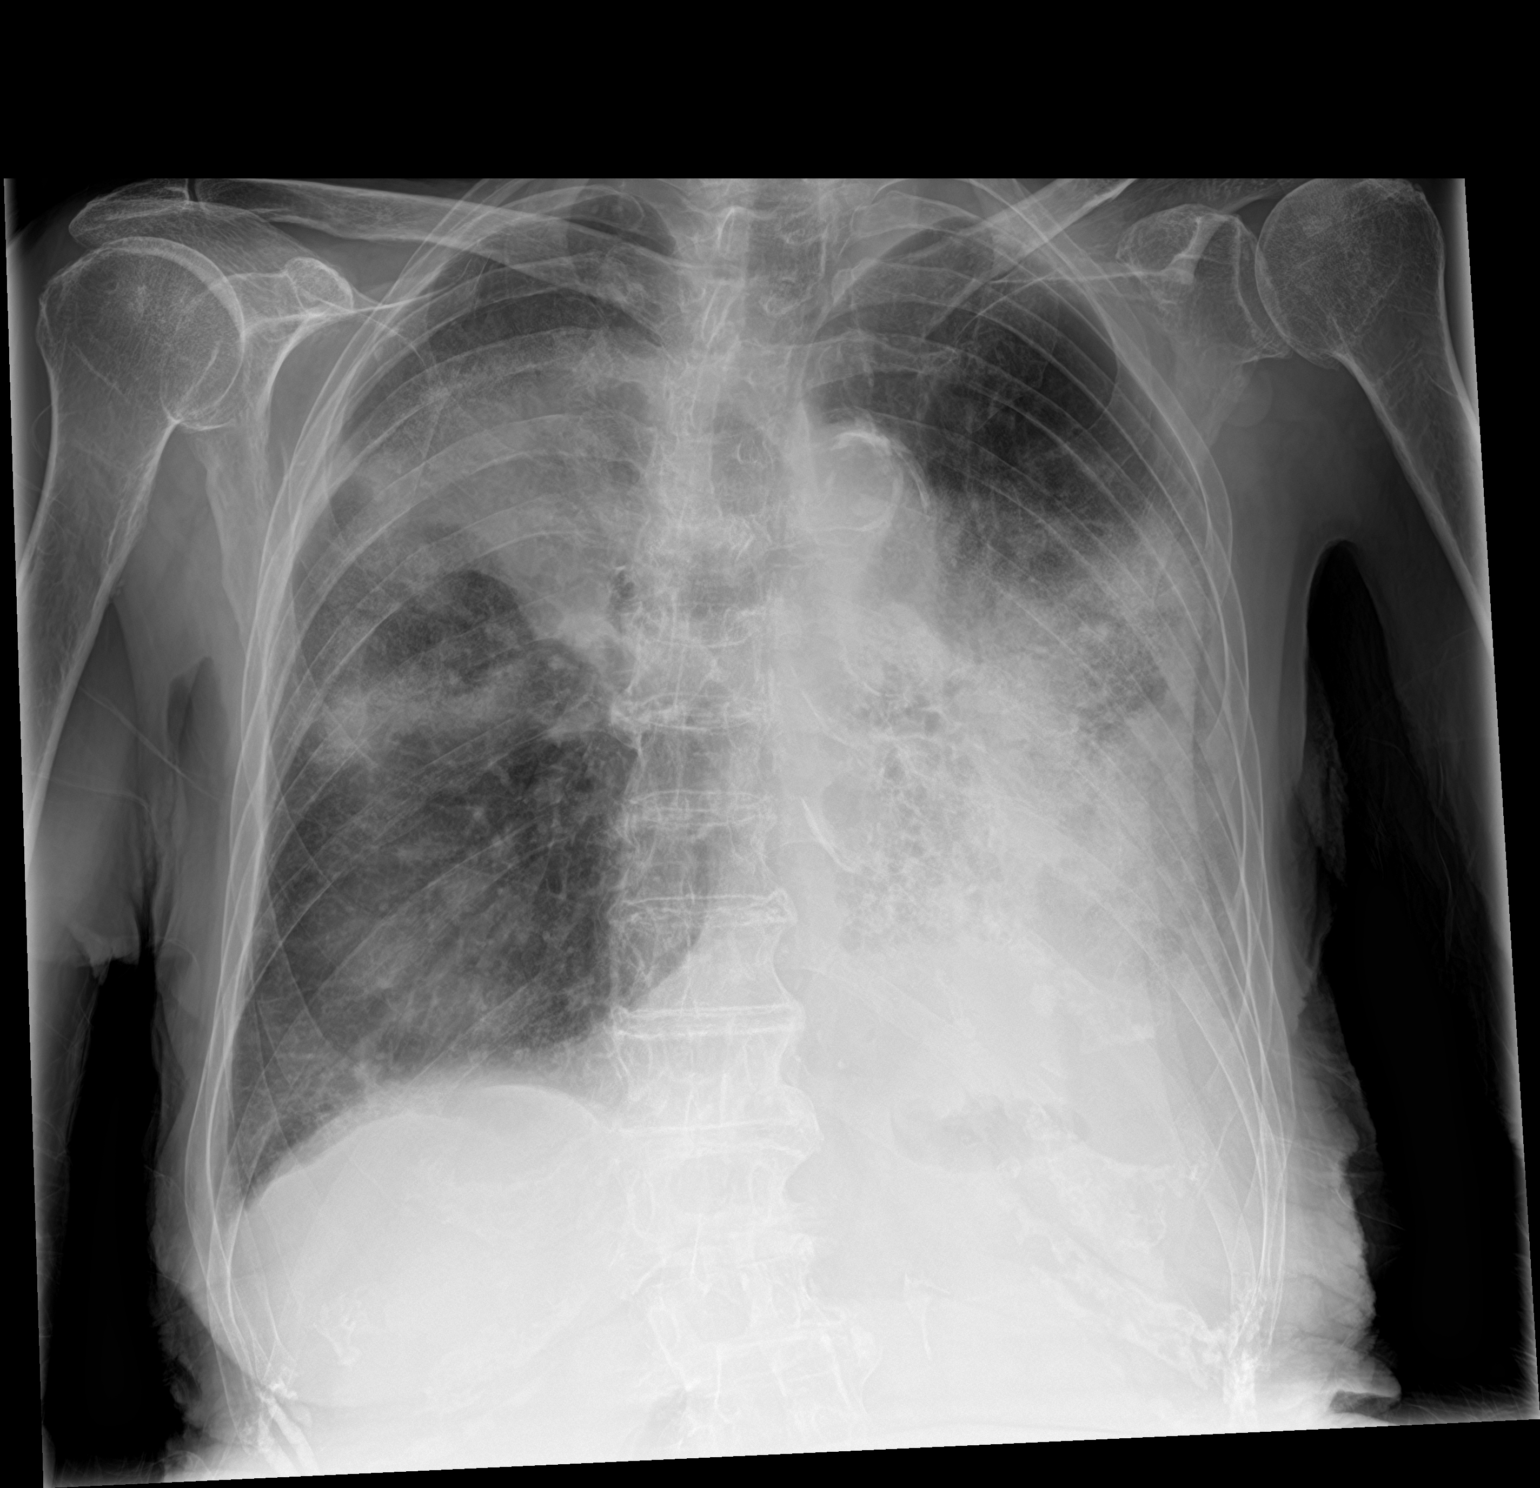

[2 of 2 positions shown; findings below may reference images not displayed]

FINDINGS: Extensive bilateral airspace opacities are worse on the left and
persist. Aeration in the left lower lobe appears worse than on the
prior examination. Cardiac silhouette is obscured. No pneumothorax
or pleural fluid. Aortic atherosclerosis is noted.
IMPRESSION: Extensive bilateral airspace disease is worse on the left where it
has progressed since the most recent examination. Findings could be
due to multifocal pneumonia and/or carcinoma.
